# Patient Record
Sex: Female | Born: 1975
Health system: Southern US, Community
[De-identification: ages and names within clinical notes are randomized; demographics above are authoritative.]

## PROBLEM LIST (undated history)

## (undated) DIAGNOSIS — T7840XA Allergy, unspecified, initial encounter: Secondary | ICD-10-CM

## (undated) DIAGNOSIS — J45909 Unspecified asthma, uncomplicated: Secondary | ICD-10-CM

## (undated) HISTORY — DX: Allergy, unspecified, initial encounter: T78.40XA

## (undated) HISTORY — PX: FINGER SURGERY: SHX640

## (undated) HISTORY — DX: Unspecified asthma, uncomplicated: J45.909

---

## 2013-06-30 ENCOUNTER — Encounter: Payer: Self-pay | Admitting: Podiatry

## 2013-06-30 ENCOUNTER — Ambulatory Visit (INDEPENDENT_AMBULATORY_CARE_PROVIDER_SITE_OTHER): Payer: BC Managed Care – PPO | Admitting: Podiatry

## 2013-06-30 VITALS — BP 122/78 | HR 82 | Resp 16 | Ht 64.0 in | Wt 185.0 lb

## 2013-06-30 DIAGNOSIS — L03039 Cellulitis of unspecified toe: Secondary | ICD-10-CM

## 2013-06-30 MED ORDER — CEPHALEXIN 500 MG PO CAPS: 500.0000 mg | ORAL_CAPSULE | Freq: Three times a day (TID) | ORAL | Status: DC

## 2013-06-30 NOTE — Progress Notes (Signed)
   Subjective:    Patient ID: Chelsea Drake, female    DOB: 07-24-1975, 37 y.o.   MRN: [id number]  HPI Comments: "My toes are bothering me"  Pt c/o of pain in toes at nail beds. Mainly, the 1st, 2nd, 3rd right and 2nd toe left. They get red and swollen and aches. Does say that she takes spin classes and shoes sometimes feel tight. This started on Thursday, Dec. 11th. Tried neosporin and a bandaid and some soaking.      Review of Systems  All other systems reviewed and are negative.       Objective:   Physical Exam: I have reviewed her past medical history medications allergies surgeries social history. Vital signs are stable she is alert and oriented x3. Pulses are strongly palpable bilateral. Capillary fill time to digits one through 5 bilateral is noted to be somewhat slow in the plantar foot demonstrates a splotchy pattern was slow capillary return. Feet are warm to the touch. Neurologic sensorium is intact per since once the monofilament. Deep tendon reflexes are intact bilateral and symmetrical. Orthopedic evaluation Mr. is all joints distal to the ankle a full range of motion without crepitus. Cutaneous evaluation other than the areas of splotchiness demonstrate toes 2 and 3 of the right foot erythema around the nails again this is splotchy erythema to the level of the DIPJ and the second digit left foot demonstrates similar findings.        Assessment & Plan:  Assessment: Possible nail trauma and tissue trauma associated with tight shoe gear for spin class. Cellulitis toes bilateral. Rule out chilblains   Plan: Epsom salts warm water soaks twice daily. Keflex 500 mg 1 by mouth 3 times a day x10 days. Followup with her in about 3 weeks

## 2013-07-21 ENCOUNTER — Ambulatory Visit: Payer: BC Managed Care – PPO | Admitting: Podiatry

## 2013-07-24 ENCOUNTER — Telehealth: Payer: Self-pay | Admitting: *Deleted

## 2013-07-24 MED ORDER — CEPHALEXIN 500 MG PO CAPS: 500.0000 mg | ORAL_CAPSULE | Freq: Three times a day (TID) | ORAL | Status: DC

## 2013-07-24 NOTE — Telephone Encounter (Signed)
Pt complains of stiff, red and swollen Right 2nd toe, that DR Al CorpusHyatt treated her in 06/2013 for what they thought to be injuries to her toes from too tight athletic shoes, was prescribed and antibiotic and they got better.  Pt states a day or two ago the Right 2nd toe began to be painful.  Pt states she has an appt 07/28/2013 with Dr Al CorpusHyatt, but what to do until then.. I asked pt if she were able to take Ibuprofen and she said yes.  I told her to take OTC Ibuprofen as the package directs, soak 1 - 2 times daily in 1/4 C Epsom Salt and 1 qt warm water, and I would refill the Cephalexin.  Pt agreed and states may wait on the antibiotic to see if improved with the other instructions.  I told her it would be at her pharmacy if she needed it.

## 2013-07-28 ENCOUNTER — Ambulatory Visit: Payer: BC Managed Care – PPO | Admitting: Podiatry

## 2013-07-30 ENCOUNTER — Ambulatory Visit: Payer: BC Managed Care – PPO | Admitting: Podiatry

## 2013-08-04 ENCOUNTER — Ambulatory Visit: Payer: BC Managed Care – PPO | Admitting: Podiatry

## 2013-08-17 ENCOUNTER — Telehealth: Payer: Self-pay | Admitting: *Deleted

## 2013-08-17 NOTE — Telephone Encounter (Signed)
Scheduled pt to come in on 09-01-13

## 2013-08-17 NOTE — Telephone Encounter (Signed)
Pt states has had multiple episodes of this ingrown, becoming painful, picked up the last antibiotic on Saturday.  I offered an appt, but pt states would like to take the antibiotic to completion and come in around the 16th to see if it acted up again.  I will have the schedulers call and schedule.

## 2013-09-01 ENCOUNTER — Ambulatory Visit: Payer: BC Managed Care – PPO | Admitting: Podiatry

## 2013-09-10 ENCOUNTER — Ambulatory Visit: Payer: BC Managed Care – PPO | Admitting: Podiatry

## 2016-01-13 DIAGNOSIS — J3089 Other allergic rhinitis: Secondary | ICD-10-CM | POA: Diagnosis not present

## 2016-02-17 DIAGNOSIS — N943 Premenstrual tension syndrome: Secondary | ICD-10-CM | POA: Diagnosis not present

## 2016-02-17 DIAGNOSIS — Z3169 Encounter for other general counseling and advice on procreation: Secondary | ICD-10-CM | POA: Diagnosis not present

## 2016-02-17 DIAGNOSIS — N898 Other specified noninflammatory disorders of vagina: Secondary | ICD-10-CM | POA: Diagnosis not present

## 2016-03-29 DIAGNOSIS — Z3169 Encounter for other general counseling and advice on procreation: Secondary | ICD-10-CM | POA: Diagnosis not present

## 2016-03-29 DIAGNOSIS — N943 Premenstrual tension syndrome: Secondary | ICD-10-CM | POA: Diagnosis not present

## 2016-03-29 DIAGNOSIS — R5383 Other fatigue: Secondary | ICD-10-CM | POA: Diagnosis not present

## 2016-04-02 DIAGNOSIS — J3089 Other allergic rhinitis: Secondary | ICD-10-CM | POA: Diagnosis not present

## 2016-05-09 DIAGNOSIS — Z23 Encounter for immunization: Secondary | ICD-10-CM | POA: Diagnosis not present

## 2016-06-27 DIAGNOSIS — J3089 Other allergic rhinitis: Secondary | ICD-10-CM | POA: Diagnosis not present

## 2016-06-27 DIAGNOSIS — Z0000 Encounter for general adult medical examination without abnormal findings: Secondary | ICD-10-CM | POA: Diagnosis not present

## 2016-07-04 DIAGNOSIS — J020 Streptococcal pharyngitis: Secondary | ICD-10-CM | POA: Diagnosis not present

## 2016-07-04 DIAGNOSIS — J029 Acute pharyngitis, unspecified: Secondary | ICD-10-CM | POA: Diagnosis not present

## 2016-07-04 DIAGNOSIS — R52 Pain, unspecified: Secondary | ICD-10-CM | POA: Diagnosis not present

## 2016-07-18 DIAGNOSIS — R6883 Chills (without fever): Secondary | ICD-10-CM | POA: Diagnosis not present

## 2016-07-18 DIAGNOSIS — R52 Pain, unspecified: Secondary | ICD-10-CM | POA: Diagnosis not present

## 2016-07-18 DIAGNOSIS — J358 Other chronic diseases of tonsils and adenoids: Secondary | ICD-10-CM | POA: Diagnosis not present

## 2016-07-18 DIAGNOSIS — R5383 Other fatigue: Secondary | ICD-10-CM | POA: Diagnosis not present

## 2016-07-18 DIAGNOSIS — R3915 Urgency of urination: Secondary | ICD-10-CM | POA: Diagnosis not present

## 2016-07-18 DIAGNOSIS — J351 Hypertrophy of tonsils: Secondary | ICD-10-CM | POA: Diagnosis not present

## 2016-07-18 DIAGNOSIS — Z8709 Personal history of other diseases of the respiratory system: Secondary | ICD-10-CM | POA: Diagnosis not present

## 2016-07-18 DIAGNOSIS — B349 Viral infection, unspecified: Secondary | ICD-10-CM | POA: Diagnosis not present

## 2016-08-13 DIAGNOSIS — Z3202 Encounter for pregnancy test, result negative: Secondary | ICD-10-CM | POA: Diagnosis not present

## 2016-08-13 DIAGNOSIS — Z01419 Encounter for gynecological examination (general) (routine) without abnormal findings: Secondary | ICD-10-CM | POA: Diagnosis not present

## 2016-08-13 DIAGNOSIS — Z6830 Body mass index (BMI) 30.0-30.9, adult: Secondary | ICD-10-CM | POA: Diagnosis not present

## 2016-08-27 DIAGNOSIS — M9905 Segmental and somatic dysfunction of pelvic region: Secondary | ICD-10-CM | POA: Diagnosis not present

## 2016-08-27 DIAGNOSIS — M9903 Segmental and somatic dysfunction of lumbar region: Secondary | ICD-10-CM | POA: Diagnosis not present

## 2016-08-27 DIAGNOSIS — M9902 Segmental and somatic dysfunction of thoracic region: Secondary | ICD-10-CM | POA: Diagnosis not present

## 2016-08-27 DIAGNOSIS — M791 Myalgia: Secondary | ICD-10-CM | POA: Diagnosis not present

## 2016-08-31 DIAGNOSIS — M791 Myalgia: Secondary | ICD-10-CM | POA: Diagnosis not present

## 2016-08-31 DIAGNOSIS — M9903 Segmental and somatic dysfunction of lumbar region: Secondary | ICD-10-CM | POA: Diagnosis not present

## 2016-08-31 DIAGNOSIS — M9905 Segmental and somatic dysfunction of pelvic region: Secondary | ICD-10-CM | POA: Diagnosis not present

## 2016-08-31 DIAGNOSIS — M9902 Segmental and somatic dysfunction of thoracic region: Secondary | ICD-10-CM | POA: Diagnosis not present

## 2016-09-03 DIAGNOSIS — M9902 Segmental and somatic dysfunction of thoracic region: Secondary | ICD-10-CM | POA: Diagnosis not present

## 2016-09-03 DIAGNOSIS — M9905 Segmental and somatic dysfunction of pelvic region: Secondary | ICD-10-CM | POA: Diagnosis not present

## 2016-09-03 DIAGNOSIS — M791 Myalgia: Secondary | ICD-10-CM | POA: Diagnosis not present

## 2016-09-03 DIAGNOSIS — M9903 Segmental and somatic dysfunction of lumbar region: Secondary | ICD-10-CM | POA: Diagnosis not present

## 2016-09-12 DIAGNOSIS — M9902 Segmental and somatic dysfunction of thoracic region: Secondary | ICD-10-CM | POA: Diagnosis not present

## 2016-09-12 DIAGNOSIS — M9903 Segmental and somatic dysfunction of lumbar region: Secondary | ICD-10-CM | POA: Diagnosis not present

## 2016-09-12 DIAGNOSIS — M9905 Segmental and somatic dysfunction of pelvic region: Secondary | ICD-10-CM | POA: Diagnosis not present

## 2016-09-12 DIAGNOSIS — M791 Myalgia: Secondary | ICD-10-CM | POA: Diagnosis not present

## 2016-09-18 DIAGNOSIS — D2230 Melanocytic nevi of unspecified part of face: Secondary | ICD-10-CM | POA: Diagnosis not present

## 2016-09-18 DIAGNOSIS — L700 Acne vulgaris: Secondary | ICD-10-CM | POA: Diagnosis not present

## 2016-09-19 DIAGNOSIS — M791 Myalgia: Secondary | ICD-10-CM | POA: Diagnosis not present

## 2016-09-19 DIAGNOSIS — M9905 Segmental and somatic dysfunction of pelvic region: Secondary | ICD-10-CM | POA: Diagnosis not present

## 2016-09-19 DIAGNOSIS — M9903 Segmental and somatic dysfunction of lumbar region: Secondary | ICD-10-CM | POA: Diagnosis not present

## 2016-09-19 DIAGNOSIS — M9902 Segmental and somatic dysfunction of thoracic region: Secondary | ICD-10-CM | POA: Diagnosis not present

## 2016-10-08 DIAGNOSIS — J3089 Other allergic rhinitis: Secondary | ICD-10-CM | POA: Diagnosis not present

## 2016-10-10 DIAGNOSIS — M25551 Pain in right hip: Secondary | ICD-10-CM | POA: Diagnosis not present

## 2016-10-10 DIAGNOSIS — G8929 Other chronic pain: Secondary | ICD-10-CM | POA: Diagnosis not present

## 2016-11-05 DIAGNOSIS — M79672 Pain in left foot: Secondary | ICD-10-CM | POA: Diagnosis not present

## 2016-11-07 DIAGNOSIS — M79672 Pain in left foot: Secondary | ICD-10-CM | POA: Diagnosis not present

## 2017-01-25 DIAGNOSIS — J3089 Other allergic rhinitis: Secondary | ICD-10-CM | POA: Diagnosis not present

## 2017-04-12 DIAGNOSIS — Z23 Encounter for immunization: Secondary | ICD-10-CM | POA: Diagnosis not present

## 2017-04-24 DIAGNOSIS — N926 Irregular menstruation, unspecified: Secondary | ICD-10-CM | POA: Diagnosis not present

## 2017-05-07 DIAGNOSIS — L309 Dermatitis, unspecified: Secondary | ICD-10-CM | POA: Diagnosis not present

## 2017-05-07 DIAGNOSIS — L719 Rosacea, unspecified: Secondary | ICD-10-CM | POA: Diagnosis not present

## 2017-05-08 DIAGNOSIS — N926 Irregular menstruation, unspecified: Secondary | ICD-10-CM | POA: Diagnosis not present

## 2017-05-15 DIAGNOSIS — J3089 Other allergic rhinitis: Secondary | ICD-10-CM | POA: Diagnosis not present

## 2017-05-28 DIAGNOSIS — N926 Irregular menstruation, unspecified: Secondary | ICD-10-CM | POA: Diagnosis not present

## 2017-06-13 DIAGNOSIS — D1801 Hemangioma of skin and subcutaneous tissue: Secondary | ICD-10-CM | POA: Diagnosis not present

## 2017-06-13 DIAGNOSIS — D224 Melanocytic nevi of scalp and neck: Secondary | ICD-10-CM | POA: Diagnosis not present

## 2017-06-13 DIAGNOSIS — L814 Other melanin hyperpigmentation: Secondary | ICD-10-CM | POA: Diagnosis not present

## 2017-06-13 DIAGNOSIS — D225 Melanocytic nevi of trunk: Secondary | ICD-10-CM | POA: Diagnosis not present

## 2017-06-25 DIAGNOSIS — J020 Streptococcal pharyngitis: Secondary | ICD-10-CM | POA: Diagnosis not present

## 2017-08-01 DIAGNOSIS — M9903 Segmental and somatic dysfunction of lumbar region: Secondary | ICD-10-CM | POA: Diagnosis not present

## 2017-08-01 DIAGNOSIS — M9905 Segmental and somatic dysfunction of pelvic region: Secondary | ICD-10-CM | POA: Diagnosis not present

## 2017-08-01 DIAGNOSIS — M62830 Muscle spasm of back: Secondary | ICD-10-CM | POA: Diagnosis not present

## 2017-08-01 DIAGNOSIS — M9902 Segmental and somatic dysfunction of thoracic region: Secondary | ICD-10-CM | POA: Diagnosis not present

## 2017-08-01 DIAGNOSIS — M791 Myalgia, unspecified site: Secondary | ICD-10-CM | POA: Diagnosis not present

## 2017-08-15 DIAGNOSIS — Z01419 Encounter for gynecological examination (general) (routine) without abnormal findings: Secondary | ICD-10-CM | POA: Diagnosis not present

## 2017-08-15 DIAGNOSIS — Z3200 Encounter for pregnancy test, result unknown: Secondary | ICD-10-CM | POA: Diagnosis not present

## 2017-08-15 DIAGNOSIS — Z1231 Encounter for screening mammogram for malignant neoplasm of breast: Secondary | ICD-10-CM | POA: Diagnosis not present

## 2017-08-15 DIAGNOSIS — Z6831 Body mass index (BMI) 31.0-31.9, adult: Secondary | ICD-10-CM | POA: Diagnosis not present

## 2017-08-15 DIAGNOSIS — N926 Irregular menstruation, unspecified: Secondary | ICD-10-CM | POA: Diagnosis not present

## 2017-08-15 DIAGNOSIS — N911 Secondary amenorrhea: Secondary | ICD-10-CM | POA: Diagnosis not present

## 2017-08-16 DIAGNOSIS — Z760 Encounter for issue of repeat prescription: Secondary | ICD-10-CM | POA: Diagnosis not present

## 2017-08-16 DIAGNOSIS — J4520 Mild intermittent asthma, uncomplicated: Secondary | ICD-10-CM | POA: Diagnosis not present

## 2017-08-16 DIAGNOSIS — J3089 Other allergic rhinitis: Secondary | ICD-10-CM | POA: Diagnosis not present

## 2017-08-30 DIAGNOSIS — S0501XD Injury of conjunctiva and corneal abrasion without foreign body, right eye, subsequent encounter: Secondary | ICD-10-CM | POA: Diagnosis not present

## 2017-09-06 DIAGNOSIS — M9905 Segmental and somatic dysfunction of pelvic region: Secondary | ICD-10-CM | POA: Diagnosis not present

## 2017-09-06 DIAGNOSIS — M791 Myalgia, unspecified site: Secondary | ICD-10-CM | POA: Diagnosis not present

## 2017-09-06 DIAGNOSIS — M62830 Muscle spasm of back: Secondary | ICD-10-CM | POA: Diagnosis not present

## 2017-09-06 DIAGNOSIS — M9903 Segmental and somatic dysfunction of lumbar region: Secondary | ICD-10-CM | POA: Diagnosis not present

## 2017-09-06 DIAGNOSIS — M9902 Segmental and somatic dysfunction of thoracic region: Secondary | ICD-10-CM | POA: Diagnosis not present

## 2017-09-16 DIAGNOSIS — Z713 Dietary counseling and surveillance: Secondary | ICD-10-CM | POA: Diagnosis not present

## 2017-09-16 DIAGNOSIS — Z6831 Body mass index (BMI) 31.0-31.9, adult: Secondary | ICD-10-CM | POA: Diagnosis not present

## 2017-10-14 DIAGNOSIS — Z713 Dietary counseling and surveillance: Secondary | ICD-10-CM | POA: Diagnosis not present

## 2017-10-14 DIAGNOSIS — Z6831 Body mass index (BMI) 31.0-31.9, adult: Secondary | ICD-10-CM | POA: Diagnosis not present

## 2017-11-01 DIAGNOSIS — L2089 Other atopic dermatitis: Secondary | ICD-10-CM | POA: Diagnosis not present

## 2017-11-01 DIAGNOSIS — L718 Other rosacea: Secondary | ICD-10-CM | POA: Diagnosis not present

## 2017-11-01 DIAGNOSIS — L814 Other melanin hyperpigmentation: Secondary | ICD-10-CM | POA: Diagnosis not present

## 2017-12-02 DIAGNOSIS — J3089 Other allergic rhinitis: Secondary | ICD-10-CM | POA: Diagnosis not present

## 2017-12-30 DIAGNOSIS — Z6831 Body mass index (BMI) 31.0-31.9, adult: Secondary | ICD-10-CM | POA: Diagnosis not present

## 2017-12-30 DIAGNOSIS — Z713 Dietary counseling and surveillance: Secondary | ICD-10-CM | POA: Diagnosis not present

## 2018-01-29 DIAGNOSIS — M9905 Segmental and somatic dysfunction of pelvic region: Secondary | ICD-10-CM | POA: Diagnosis not present

## 2018-01-29 DIAGNOSIS — M9903 Segmental and somatic dysfunction of lumbar region: Secondary | ICD-10-CM | POA: Diagnosis not present

## 2018-01-29 DIAGNOSIS — M62830 Muscle spasm of back: Secondary | ICD-10-CM | POA: Diagnosis not present

## 2018-01-29 DIAGNOSIS — M9902 Segmental and somatic dysfunction of thoracic region: Secondary | ICD-10-CM | POA: Diagnosis not present

## 2018-02-10 DIAGNOSIS — M9903 Segmental and somatic dysfunction of lumbar region: Secondary | ICD-10-CM | POA: Diagnosis not present

## 2018-02-10 DIAGNOSIS — M9902 Segmental and somatic dysfunction of thoracic region: Secondary | ICD-10-CM | POA: Diagnosis not present

## 2018-02-10 DIAGNOSIS — M9905 Segmental and somatic dysfunction of pelvic region: Secondary | ICD-10-CM | POA: Diagnosis not present

## 2018-02-10 DIAGNOSIS — M62830 Muscle spasm of back: Secondary | ICD-10-CM | POA: Diagnosis not present

## 2018-02-12 DIAGNOSIS — N766 Ulceration of vulva: Secondary | ICD-10-CM | POA: Diagnosis not present

## 2018-02-12 DIAGNOSIS — R300 Dysuria: Secondary | ICD-10-CM | POA: Diagnosis not present

## 2018-02-27 DIAGNOSIS — M9903 Segmental and somatic dysfunction of lumbar region: Secondary | ICD-10-CM | POA: Diagnosis not present

## 2018-02-27 DIAGNOSIS — M9905 Segmental and somatic dysfunction of pelvic region: Secondary | ICD-10-CM | POA: Diagnosis not present

## 2018-02-27 DIAGNOSIS — M62830 Muscle spasm of back: Secondary | ICD-10-CM | POA: Diagnosis not present

## 2018-02-27 DIAGNOSIS — M9902 Segmental and somatic dysfunction of thoracic region: Secondary | ICD-10-CM | POA: Diagnosis not present

## 2018-03-11 DIAGNOSIS — M62830 Muscle spasm of back: Secondary | ICD-10-CM | POA: Diagnosis not present

## 2018-03-11 DIAGNOSIS — M9903 Segmental and somatic dysfunction of lumbar region: Secondary | ICD-10-CM | POA: Diagnosis not present

## 2018-03-11 DIAGNOSIS — M9905 Segmental and somatic dysfunction of pelvic region: Secondary | ICD-10-CM | POA: Diagnosis not present

## 2018-03-11 DIAGNOSIS — M9902 Segmental and somatic dysfunction of thoracic region: Secondary | ICD-10-CM | POA: Diagnosis not present

## 2018-03-25 DIAGNOSIS — M62830 Muscle spasm of back: Secondary | ICD-10-CM | POA: Diagnosis not present

## 2018-03-25 DIAGNOSIS — M9903 Segmental and somatic dysfunction of lumbar region: Secondary | ICD-10-CM | POA: Diagnosis not present

## 2018-03-25 DIAGNOSIS — M9905 Segmental and somatic dysfunction of pelvic region: Secondary | ICD-10-CM | POA: Diagnosis not present

## 2018-03-25 DIAGNOSIS — M9902 Segmental and somatic dysfunction of thoracic region: Secondary | ICD-10-CM | POA: Diagnosis not present

## 2018-07-28 DIAGNOSIS — J3089 Other allergic rhinitis: Secondary | ICD-10-CM | POA: Diagnosis not present

## 2018-07-28 DIAGNOSIS — J4520 Mild intermittent asthma, uncomplicated: Secondary | ICD-10-CM | POA: Diagnosis not present

## 2018-07-30 DIAGNOSIS — R790 Abnormal level of blood mineral: Secondary | ICD-10-CM | POA: Diagnosis not present

## 2018-07-30 DIAGNOSIS — E669 Obesity, unspecified: Secondary | ICD-10-CM | POA: Diagnosis not present

## 2018-07-30 DIAGNOSIS — E782 Mixed hyperlipidemia: Secondary | ICD-10-CM | POA: Diagnosis not present

## 2018-07-30 DIAGNOSIS — R7989 Other specified abnormal findings of blood chemistry: Secondary | ICD-10-CM | POA: Diagnosis not present

## 2018-08-06 DIAGNOSIS — E782 Mixed hyperlipidemia: Secondary | ICD-10-CM | POA: Diagnosis not present

## 2018-08-06 DIAGNOSIS — E669 Obesity, unspecified: Secondary | ICD-10-CM | POA: Diagnosis not present

## 2018-08-13 DIAGNOSIS — E669 Obesity, unspecified: Secondary | ICD-10-CM | POA: Diagnosis not present

## 2018-08-13 DIAGNOSIS — R7989 Other specified abnormal findings of blood chemistry: Secondary | ICD-10-CM | POA: Diagnosis not present

## 2018-08-19 DIAGNOSIS — Z6831 Body mass index (BMI) 31.0-31.9, adult: Secondary | ICD-10-CM | POA: Diagnosis not present

## 2018-08-19 DIAGNOSIS — Z01419 Encounter for gynecological examination (general) (routine) without abnormal findings: Secondary | ICD-10-CM | POA: Diagnosis not present

## 2018-08-19 DIAGNOSIS — Z1231 Encounter for screening mammogram for malignant neoplasm of breast: Secondary | ICD-10-CM | POA: Diagnosis not present

## 2018-08-19 DIAGNOSIS — Z1151 Encounter for screening for human papillomavirus (HPV): Secondary | ICD-10-CM | POA: Diagnosis not present

## 2018-08-19 DIAGNOSIS — Z113 Encounter for screening for infections with a predominantly sexual mode of transmission: Secondary | ICD-10-CM | POA: Diagnosis not present

## 2018-08-20 DIAGNOSIS — Z6832 Body mass index (BMI) 32.0-32.9, adult: Secondary | ICD-10-CM | POA: Diagnosis not present

## 2018-08-20 DIAGNOSIS — R790 Abnormal level of blood mineral: Secondary | ICD-10-CM | POA: Diagnosis not present

## 2018-08-27 ENCOUNTER — Ambulatory Visit: Payer: Self-pay | Admitting: Family Medicine

## 2018-09-06 DIAGNOSIS — M9902 Segmental and somatic dysfunction of thoracic region: Secondary | ICD-10-CM | POA: Diagnosis not present

## 2018-09-06 DIAGNOSIS — M9903 Segmental and somatic dysfunction of lumbar region: Secondary | ICD-10-CM | POA: Diagnosis not present

## 2018-09-06 DIAGNOSIS — M62830 Muscle spasm of back: Secondary | ICD-10-CM | POA: Diagnosis not present

## 2018-09-06 DIAGNOSIS — M9905 Segmental and somatic dysfunction of pelvic region: Secondary | ICD-10-CM | POA: Diagnosis not present

## 2018-09-15 ENCOUNTER — Ambulatory Visit: Payer: Self-pay | Admitting: Family Medicine

## 2018-09-18 DIAGNOSIS — Z0000 Encounter for general adult medical examination without abnormal findings: Secondary | ICD-10-CM | POA: Diagnosis not present

## 2018-09-19 DIAGNOSIS — J069 Acute upper respiratory infection, unspecified: Secondary | ICD-10-CM | POA: Diagnosis not present

## 2018-09-19 DIAGNOSIS — Z0000 Encounter for general adult medical examination without abnormal findings: Secondary | ICD-10-CM | POA: Diagnosis not present

## 2018-09-19 DIAGNOSIS — Z760 Encounter for issue of repeat prescription: Secondary | ICD-10-CM | POA: Diagnosis not present

## 2018-09-19 DIAGNOSIS — J4520 Mild intermittent asthma, uncomplicated: Secondary | ICD-10-CM | POA: Diagnosis not present

## 2018-09-26 DIAGNOSIS — J069 Acute upper respiratory infection, unspecified: Secondary | ICD-10-CM | POA: Diagnosis not present

## 2018-10-01 DIAGNOSIS — F410 Panic disorder [episodic paroxysmal anxiety] without agoraphobia: Secondary | ICD-10-CM | POA: Diagnosis not present

## 2018-10-01 DIAGNOSIS — F419 Anxiety disorder, unspecified: Secondary | ICD-10-CM | POA: Diagnosis not present

## 2018-10-06 ENCOUNTER — Ambulatory Visit: Payer: Self-pay | Admitting: Family Medicine

## 2018-10-08 DIAGNOSIS — F419 Anxiety disorder, unspecified: Secondary | ICD-10-CM | POA: Diagnosis not present

## 2018-10-08 DIAGNOSIS — F410 Panic disorder [episodic paroxysmal anxiety] without agoraphobia: Secondary | ICD-10-CM | POA: Diagnosis not present

## 2018-10-13 DIAGNOSIS — L650 Telogen effluvium: Secondary | ICD-10-CM | POA: Diagnosis not present

## 2018-10-15 DIAGNOSIS — F419 Anxiety disorder, unspecified: Secondary | ICD-10-CM | POA: Diagnosis not present

## 2018-10-15 DIAGNOSIS — F410 Panic disorder [episodic paroxysmal anxiety] without agoraphobia: Secondary | ICD-10-CM | POA: Diagnosis not present

## 2018-10-16 DIAGNOSIS — J4520 Mild intermittent asthma, uncomplicated: Secondary | ICD-10-CM | POA: Diagnosis not present

## 2018-10-16 DIAGNOSIS — L659 Nonscarring hair loss, unspecified: Secondary | ICD-10-CM | POA: Diagnosis not present

## 2018-10-16 DIAGNOSIS — Z760 Encounter for issue of repeat prescription: Secondary | ICD-10-CM | POA: Diagnosis not present

## 2018-10-21 DIAGNOSIS — F410 Panic disorder [episodic paroxysmal anxiety] without agoraphobia: Secondary | ICD-10-CM | POA: Diagnosis not present

## 2018-10-21 DIAGNOSIS — F419 Anxiety disorder, unspecified: Secondary | ICD-10-CM | POA: Diagnosis not present

## 2018-10-29 DIAGNOSIS — F419 Anxiety disorder, unspecified: Secondary | ICD-10-CM | POA: Diagnosis not present

## 2018-10-29 DIAGNOSIS — F410 Panic disorder [episodic paroxysmal anxiety] without agoraphobia: Secondary | ICD-10-CM | POA: Diagnosis not present

## 2018-11-05 DIAGNOSIS — F419 Anxiety disorder, unspecified: Secondary | ICD-10-CM | POA: Diagnosis not present

## 2018-11-05 DIAGNOSIS — F410 Panic disorder [episodic paroxysmal anxiety] without agoraphobia: Secondary | ICD-10-CM | POA: Diagnosis not present

## 2018-11-07 ENCOUNTER — Ambulatory Visit: Payer: Self-pay | Admitting: Family Medicine

## 2018-11-12 DIAGNOSIS — F419 Anxiety disorder, unspecified: Secondary | ICD-10-CM | POA: Diagnosis not present

## 2018-11-12 DIAGNOSIS — F410 Panic disorder [episodic paroxysmal anxiety] without agoraphobia: Secondary | ICD-10-CM | POA: Diagnosis not present

## 2018-11-19 DIAGNOSIS — F419 Anxiety disorder, unspecified: Secondary | ICD-10-CM | POA: Diagnosis not present

## 2018-11-19 DIAGNOSIS — F410 Panic disorder [episodic paroxysmal anxiety] without agoraphobia: Secondary | ICD-10-CM | POA: Diagnosis not present

## 2018-11-25 DIAGNOSIS — F410 Panic disorder [episodic paroxysmal anxiety] without agoraphobia: Secondary | ICD-10-CM | POA: Diagnosis not present

## 2018-11-25 DIAGNOSIS — Z760 Encounter for issue of repeat prescription: Secondary | ICD-10-CM | POA: Diagnosis not present

## 2018-11-25 DIAGNOSIS — J3089 Other allergic rhinitis: Secondary | ICD-10-CM | POA: Diagnosis not present

## 2018-11-25 DIAGNOSIS — J4520 Mild intermittent asthma, uncomplicated: Secondary | ICD-10-CM | POA: Diagnosis not present

## 2018-11-25 DIAGNOSIS — F419 Anxiety disorder, unspecified: Secondary | ICD-10-CM | POA: Diagnosis not present

## 2018-11-28 DIAGNOSIS — M62830 Muscle spasm of back: Secondary | ICD-10-CM | POA: Diagnosis not present

## 2018-11-28 DIAGNOSIS — M9903 Segmental and somatic dysfunction of lumbar region: Secondary | ICD-10-CM | POA: Diagnosis not present

## 2018-11-28 DIAGNOSIS — M9905 Segmental and somatic dysfunction of pelvic region: Secondary | ICD-10-CM | POA: Diagnosis not present

## 2018-11-28 DIAGNOSIS — M9902 Segmental and somatic dysfunction of thoracic region: Secondary | ICD-10-CM | POA: Diagnosis not present

## 2018-12-03 DIAGNOSIS — F419 Anxiety disorder, unspecified: Secondary | ICD-10-CM | POA: Diagnosis not present

## 2018-12-03 DIAGNOSIS — F410 Panic disorder [episodic paroxysmal anxiety] without agoraphobia: Secondary | ICD-10-CM | POA: Diagnosis not present

## 2018-12-04 ENCOUNTER — Other Ambulatory Visit: Payer: Self-pay

## 2018-12-04 DIAGNOSIS — J069 Acute upper respiratory infection, unspecified: Secondary | ICD-10-CM | POA: Diagnosis not present

## 2018-12-05 LAB — SAR COV2 SEROLOGY (COVID19)AB(IGG),IA: SARS-CoV-2 Ab, IgG: NEGATIVE

## 2018-12-10 DIAGNOSIS — F410 Panic disorder [episodic paroxysmal anxiety] without agoraphobia: Secondary | ICD-10-CM | POA: Diagnosis not present

## 2018-12-10 DIAGNOSIS — F419 Anxiety disorder, unspecified: Secondary | ICD-10-CM | POA: Diagnosis not present

## 2018-12-11 DIAGNOSIS — L658 Other specified nonscarring hair loss: Secondary | ICD-10-CM | POA: Diagnosis not present

## 2018-12-11 DIAGNOSIS — N898 Other specified noninflammatory disorders of vagina: Secondary | ICD-10-CM | POA: Diagnosis not present

## 2018-12-12 DIAGNOSIS — L658 Other specified nonscarring hair loss: Secondary | ICD-10-CM | POA: Diagnosis not present

## 2018-12-17 DIAGNOSIS — F410 Panic disorder [episodic paroxysmal anxiety] without agoraphobia: Secondary | ICD-10-CM | POA: Diagnosis not present

## 2018-12-17 DIAGNOSIS — L658 Other specified nonscarring hair loss: Secondary | ICD-10-CM | POA: Diagnosis not present

## 2018-12-17 DIAGNOSIS — F419 Anxiety disorder, unspecified: Secondary | ICD-10-CM | POA: Diagnosis not present

## 2018-12-17 DIAGNOSIS — L659 Nonscarring hair loss, unspecified: Secondary | ICD-10-CM | POA: Diagnosis not present

## 2018-12-24 DIAGNOSIS — F410 Panic disorder [episodic paroxysmal anxiety] without agoraphobia: Secondary | ICD-10-CM | POA: Diagnosis not present

## 2018-12-24 DIAGNOSIS — F419 Anxiety disorder, unspecified: Secondary | ICD-10-CM | POA: Diagnosis not present

## 2018-12-29 DIAGNOSIS — F419 Anxiety disorder, unspecified: Secondary | ICD-10-CM | POA: Diagnosis not present

## 2018-12-29 DIAGNOSIS — F410 Panic disorder [episodic paroxysmal anxiety] without agoraphobia: Secondary | ICD-10-CM | POA: Diagnosis not present

## 2019-01-09 DIAGNOSIS — F419 Anxiety disorder, unspecified: Secondary | ICD-10-CM | POA: Diagnosis not present

## 2019-01-09 DIAGNOSIS — F410 Panic disorder [episodic paroxysmal anxiety] without agoraphobia: Secondary | ICD-10-CM | POA: Diagnosis not present

## 2019-01-14 DIAGNOSIS — F419 Anxiety disorder, unspecified: Secondary | ICD-10-CM | POA: Diagnosis not present

## 2019-01-14 DIAGNOSIS — F410 Panic disorder [episodic paroxysmal anxiety] without agoraphobia: Secondary | ICD-10-CM | POA: Diagnosis not present

## 2019-01-19 DIAGNOSIS — L738 Other specified follicular disorders: Secondary | ICD-10-CM | POA: Diagnosis not present

## 2019-01-19 DIAGNOSIS — L559 Sunburn, unspecified: Secondary | ICD-10-CM | POA: Diagnosis not present

## 2019-01-19 DIAGNOSIS — L728 Other follicular cysts of the skin and subcutaneous tissue: Secondary | ICD-10-CM | POA: Diagnosis not present

## 2019-01-19 DIAGNOSIS — L658 Other specified nonscarring hair loss: Secondary | ICD-10-CM | POA: Diagnosis not present

## 2019-01-21 ENCOUNTER — Encounter: Payer: Self-pay | Admitting: Family Medicine

## 2019-01-21 ENCOUNTER — Other Ambulatory Visit: Payer: Self-pay

## 2019-01-21 ENCOUNTER — Ambulatory Visit: Payer: BC Managed Care – PPO | Admitting: Family Medicine

## 2019-01-21 DIAGNOSIS — F410 Panic disorder [episodic paroxysmal anxiety] without agoraphobia: Secondary | ICD-10-CM | POA: Diagnosis not present

## 2019-01-21 DIAGNOSIS — J302 Other seasonal allergic rhinitis: Secondary | ICD-10-CM

## 2019-01-21 DIAGNOSIS — J4520 Mild intermittent asthma, uncomplicated: Secondary | ICD-10-CM

## 2019-01-21 DIAGNOSIS — F419 Anxiety disorder, unspecified: Secondary | ICD-10-CM

## 2019-01-21 DIAGNOSIS — J45909 Unspecified asthma, uncomplicated: Secondary | ICD-10-CM | POA: Insufficient documentation

## 2019-01-21 MED ORDER — ALBUTEROL SULFATE HFA 108 (90 BASE) MCG/ACT IN AERS: 2.0000 | INHALATION_SPRAY | Freq: Four times a day (QID) | RESPIRATORY_TRACT | Status: DC | PRN

## 2019-01-21 MED ORDER — HYDROXYZINE HCL 25 MG PO TABS: 12.5000 mg | ORAL_TABLET | Freq: Every day | ORAL | 2 refills | Status: DC | PRN

## 2019-01-21 NOTE — Progress Notes (Signed)
Chelsea Drake DOB: 08/22/1975 Encounter date: 01/21/2019  This is a 42 y.o. female who presents to establish care. Chief Complaint  Patient presents with  . Establish Care    History of present illness: Last August - woke up and had red bumps outside of vagina - hadn't had those before. Went to obgyn and had swab done and thought that it was herpes. Bumps did get worse while waiting for cx. More bumps and had started to note a couple on bottom - also had some swollen glands, nerve pain, fatigue/viral type sx. Was given valtrex which improved sx significantly in 2 days but culture came back negative. Dad (who is gyn) stated that it could have been shingles.   Hasn't had regular doc since moving here 6 years ago. She just sees NP at work and had seen doc one time when she moved here but never really established care.   Does follow with obgyn (Dr. Fogleman)  Did see dermatology ([facility], Dr. Gray). Has started to have some hair loss in hairline. Had some bloodwork through them - iron, thyroid, met panel which were all normal.   Does get bloodwork done through work and health screening/eval. This has been normal for her.   Asthma: doesn't keep her from doing activities from day to day. But during allergy season wlil notice it more. Takes singulair which helps. Does have albuterol inhaler but not really using it unless sick or significant allergy flare. In spring had flare after trip to [place]. Was given symbicort inhaler which she used short term at that time. Had been years since on controller inhaler previously.  Allergies: ok control with flonase, singulair, zyrtec. Uses sudafed if needed on days where she has more sinus issues.   Has struggled with weight loss always. Worse since turning 41.   Anxiety: recently had friend who had stroke (younger than her, pregnant, in rehab). Does have xanax which she rarely takes. Doesn't typically have panic attacks, but states that she is more  "type A person" and just gets more easily upset/triggered by things. Maybe will use twice monthly; not been on other medications for anxiety. She does do therapy. Started years ago after recommendation from therapist- more in evening to help with sleep/stress. Getting xanax from Dr. Fogleman. Uses 0.5mg tab and breaks this in half when she uses it.   Past Medical History:  Diagnosis Date  . Allergy   . Asthma    Past Surgical History:  Procedure Laterality Date  . FINGER SURGERY Right    5th digit   No Known Allergies Current Meds  Medication Sig  . CALCIUM PO Take by mouth daily.  . cetirizine (ZYRTEC) 5 MG tablet Take 5 mg by mouth daily.  . Fluticasone Propionate (FLONASE NA) Place into the nose.  . Levonorgestrel (KYLEENA) 19.5 MG IUD by Intrauterine route.  . Montelukast Sodium (SINGULAIR PO) Take by mouth.  . Multiple Vitamin (MULTIVITAMIN WITH MINERALS) TABS tablet Take 1 tablet by mouth daily.   Social History   Tobacco Use  . Smoking status: Never Smoker  . Smokeless tobacco: Never Used  Substance Use Topics  . Alcohol use: Yes   Family History  Problem Relation Age of Onset  . Asthma Father   . Hypertrophic cardiomyopathy Father      Review of Systems  Constitutional: Negative for chills, fatigue and fever.  Respiratory: Negative for cough, chest tightness, shortness of breath and wheezing.   Cardiovascular: Negative for chest pain, palpitations and   leg swelling.  Psychiatric/Behavioral: Positive for sleep disturbance (just when anxious). The patient is nervous/anxious.     Objective:  BP 102/80 (BP Location: Left Arm, Patient Position: Sitting, Cuff Size: Large)   Pulse 68   Temp 97.6 F (36.4 C) (Oral)   Ht 5' 4.5" (1.638 m)   Wt 188 lb 3.2 oz (85.4 kg)   LMP 01/12/2019 (Approximate) Comment: IUD  SpO2 98%   BMI 31.81 kg/m   Weight: 188 lb 3.2 oz (85.4 kg)   BP Readings from Last 3 Encounters:  01/21/19 102/80  06/30/13 122/78   Wt Readings  from Last 3 Encounters:  01/21/19 188 lb 3.2 oz (85.4 kg)  06/30/13 185 lb (83.9 kg)    Physical Exam Constitutional:      General: She is not in acute distress.    Appearance: She is well-developed.  Cardiovascular:     Rate and Rhythm: Normal rate and regular rhythm.     Heart sounds: Normal heart sounds. No murmur. No friction rub.  Pulmonary:     Effort: Pulmonary effort is normal. No respiratory distress.     Breath sounds: Normal breath sounds. No wheezing or rales.  Musculoskeletal:     Right lower leg: No edema.     Left lower leg: No edema.  Neurological:     Mental Status: She is alert and oriented to person, place, and time.  Psychiatric:        Behavior: Behavior normal.     Assessment/Plan  1. Mild intermittent asthma without complication Well-controlled.  Continue as needed albuterol inhaler. - albuterol (VENTOLIN HFA) 108 (90 Base) MCG/ACT inhaler; Inhale 2 puffs into the lungs every 6 (six) hours as needed for wheezing or shortness of breath.  2. Anxiety We discussed some of risks of using benzodiazepines long-term.  Would be ideal if she were able to control anxiety with noncontrolled substances.  Since she usually only takes the Xanax at bedtime to help with anxiety that affects her sleep, it is reasonable to try hydroxyzine.  She will let me know how she does with this. - hydrOXYzine (ATARAX/VISTARIL) 25 MG tablet; Take 0.5-1 tablets (12.5-25 mg total) by mouth daily as needed for anxiety.  Dispense: 30 tablet; Refill: 2  3. Seasonal allergies Controlled.  Continue over-the-counter medications as needed.    Return in about 6 months (around 07/24/2019) for physical exam.   Junell Koberlein, MD    

## 2019-01-26 DIAGNOSIS — F410 Panic disorder [episodic paroxysmal anxiety] without agoraphobia: Secondary | ICD-10-CM | POA: Diagnosis not present

## 2019-01-26 DIAGNOSIS — F419 Anxiety disorder, unspecified: Secondary | ICD-10-CM | POA: Diagnosis not present

## 2019-02-02 DIAGNOSIS — F410 Panic disorder [episodic paroxysmal anxiety] without agoraphobia: Secondary | ICD-10-CM | POA: Diagnosis not present

## 2019-02-02 DIAGNOSIS — F419 Anxiety disorder, unspecified: Secondary | ICD-10-CM | POA: Diagnosis not present

## 2019-02-09 DIAGNOSIS — F419 Anxiety disorder, unspecified: Secondary | ICD-10-CM | POA: Diagnosis not present

## 2019-02-09 DIAGNOSIS — F410 Panic disorder [episodic paroxysmal anxiety] without agoraphobia: Secondary | ICD-10-CM | POA: Diagnosis not present

## 2019-02-13 ENCOUNTER — Other Ambulatory Visit: Payer: Self-pay | Admitting: Family Medicine

## 2019-02-13 DIAGNOSIS — F419 Anxiety disorder, unspecified: Secondary | ICD-10-CM

## 2019-02-16 DIAGNOSIS — F411 Generalized anxiety disorder: Secondary | ICD-10-CM | POA: Diagnosis not present

## 2019-02-19 DIAGNOSIS — Z20828 Contact with and (suspected) exposure to other viral communicable diseases: Secondary | ICD-10-CM | POA: Diagnosis not present

## 2019-02-22 DIAGNOSIS — F411 Generalized anxiety disorder: Secondary | ICD-10-CM | POA: Diagnosis not present

## 2019-02-23 DIAGNOSIS — F411 Generalized anxiety disorder: Secondary | ICD-10-CM | POA: Diagnosis not present

## 2019-03-02 DIAGNOSIS — F411 Generalized anxiety disorder: Secondary | ICD-10-CM | POA: Diagnosis not present

## 2019-03-09 DIAGNOSIS — F411 Generalized anxiety disorder: Secondary | ICD-10-CM | POA: Diagnosis not present

## 2019-03-10 DIAGNOSIS — Z760 Encounter for issue of repeat prescription: Secondary | ICD-10-CM | POA: Diagnosis not present

## 2019-03-10 DIAGNOSIS — J3089 Other allergic rhinitis: Secondary | ICD-10-CM | POA: Diagnosis not present

## 2019-03-16 ENCOUNTER — Telehealth: Payer: Self-pay | Admitting: Family Medicine

## 2019-03-16 ENCOUNTER — Telehealth (INDEPENDENT_AMBULATORY_CARE_PROVIDER_SITE_OTHER): Payer: BC Managed Care – PPO | Admitting: Family Medicine

## 2019-03-16 ENCOUNTER — Other Ambulatory Visit: Payer: Self-pay

## 2019-03-16 DIAGNOSIS — M25512 Pain in left shoulder: Secondary | ICD-10-CM | POA: Diagnosis not present

## 2019-03-16 DIAGNOSIS — M9902 Segmental and somatic dysfunction of thoracic region: Secondary | ICD-10-CM | POA: Diagnosis not present

## 2019-03-16 DIAGNOSIS — M62830 Muscle spasm of back: Secondary | ICD-10-CM | POA: Diagnosis not present

## 2019-03-16 DIAGNOSIS — M542 Cervicalgia: Secondary | ICD-10-CM | POA: Diagnosis not present

## 2019-03-16 DIAGNOSIS — M9903 Segmental and somatic dysfunction of lumbar region: Secondary | ICD-10-CM | POA: Diagnosis not present

## 2019-03-16 DIAGNOSIS — M9905 Segmental and somatic dysfunction of pelvic region: Secondary | ICD-10-CM | POA: Diagnosis not present

## 2019-03-16 MED ORDER — CYCLOBENZAPRINE HCL 5 MG PO TABS: 5.0000 mg | ORAL_TABLET | Freq: Three times a day (TID) | ORAL | 0 refills | Status: DC | PRN

## 2019-03-16 NOTE — Telephone Encounter (Signed)
I called the pt and an appt was scheduled for today at 1:30pm with Dr Volanda Napoleon as Dr Ethlyn Gallery does not have an openings today.
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
## 2019-03-16 NOTE — Telephone Encounter (Signed)
Copied from [place] [id number]. Topic: Appointment Scheduling - Scheduling Inquiry for Clinic >> Mar 16, 2019 12:28 PM Rainey Pines A wrote: Patient would like to be seen today or tomorrow for neck and shoulder pain. Patient would like a callback. Attempted to reach office 3x

## 2019-03-17 DIAGNOSIS — F411 Generalized anxiety disorder: Secondary | ICD-10-CM | POA: Diagnosis not present

## 2019-03-25 DIAGNOSIS — M9905 Segmental and somatic dysfunction of pelvic region: Secondary | ICD-10-CM | POA: Diagnosis not present

## 2019-03-25 DIAGNOSIS — M62830 Muscle spasm of back: Secondary | ICD-10-CM | POA: Diagnosis not present

## 2019-03-25 DIAGNOSIS — M9903 Segmental and somatic dysfunction of lumbar region: Secondary | ICD-10-CM | POA: Diagnosis not present

## 2019-03-25 DIAGNOSIS — M9902 Segmental and somatic dysfunction of thoracic region: Secondary | ICD-10-CM | POA: Diagnosis not present

## 2019-03-30 DIAGNOSIS — F411 Generalized anxiety disorder: Secondary | ICD-10-CM | POA: Diagnosis not present

## 2019-04-09 DIAGNOSIS — F411 Generalized anxiety disorder: Secondary | ICD-10-CM | POA: Diagnosis not present

## 2019-04-14 DIAGNOSIS — M62830 Muscle spasm of back: Secondary | ICD-10-CM | POA: Diagnosis not present

## 2019-04-14 DIAGNOSIS — M9905 Segmental and somatic dysfunction of pelvic region: Secondary | ICD-10-CM | POA: Diagnosis not present

## 2019-04-14 DIAGNOSIS — M9903 Segmental and somatic dysfunction of lumbar region: Secondary | ICD-10-CM | POA: Diagnosis not present

## 2019-04-14 DIAGNOSIS — M9902 Segmental and somatic dysfunction of thoracic region: Secondary | ICD-10-CM | POA: Diagnosis not present

## 2019-04-20 DIAGNOSIS — F411 Generalized anxiety disorder: Secondary | ICD-10-CM | POA: Diagnosis not present

## 2019-04-21 DIAGNOSIS — M9905 Segmental and somatic dysfunction of pelvic region: Secondary | ICD-10-CM | POA: Diagnosis not present

## 2019-04-21 DIAGNOSIS — M62830 Muscle spasm of back: Secondary | ICD-10-CM | POA: Diagnosis not present

## 2019-04-21 DIAGNOSIS — M9902 Segmental and somatic dysfunction of thoracic region: Secondary | ICD-10-CM | POA: Diagnosis not present

## 2019-04-21 DIAGNOSIS — M9903 Segmental and somatic dysfunction of lumbar region: Secondary | ICD-10-CM | POA: Diagnosis not present

## 2019-04-28 DIAGNOSIS — M62830 Muscle spasm of back: Secondary | ICD-10-CM | POA: Diagnosis not present

## 2019-04-28 DIAGNOSIS — M9903 Segmental and somatic dysfunction of lumbar region: Secondary | ICD-10-CM | POA: Diagnosis not present

## 2019-04-28 DIAGNOSIS — M9905 Segmental and somatic dysfunction of pelvic region: Secondary | ICD-10-CM | POA: Diagnosis not present

## 2019-04-28 DIAGNOSIS — M9902 Segmental and somatic dysfunction of thoracic region: Secondary | ICD-10-CM | POA: Diagnosis not present

## 2019-04-29 DIAGNOSIS — F411 Generalized anxiety disorder: Secondary | ICD-10-CM | POA: Diagnosis not present

## 2019-05-04 DIAGNOSIS — F411 Generalized anxiety disorder: Secondary | ICD-10-CM | POA: Diagnosis not present

## 2019-05-11 DIAGNOSIS — F411 Generalized anxiety disorder: Secondary | ICD-10-CM | POA: Diagnosis not present

## 2019-05-13 ENCOUNTER — Ambulatory Visit: Payer: Self-pay | Admitting: *Deleted

## 2019-05-13 DIAGNOSIS — Z711 Person with feared health complaint in whom no diagnosis is made: Secondary | ICD-10-CM | POA: Diagnosis not present

## 2019-05-13 NOTE — Telephone Encounter (Signed)
I returned her call.    I answered her questions pertaining to getting tested for COVID-19 if friend's test result comes back positive or negative.   They have a trip planned this weekend.   I also explained about the 14 day quarantine needed if the friend's test comes back positive. She decided to reschedule the trip to be on the safe side after I answered her questions.  I went over the care advice with her and answered her many questions.  She thanked me for my help.     I let her know to call back for further questions.   Reason for Disposition . COVID-19 Testing, questions about  Answer Assessment - Initial Assessment Questions 1. CLOSE CONTACT: "Who is the person with the confirmed or suspected COVID-19 infection that you were exposed to?"     I'm not 100% sure if exposed.     My husband's friend came over on Sat. For 25 minutes.   My husband was closer to him but I was not near the friend.  2. PLACE of CONTACT: "Where were you when you were exposed to COVID-19?" (e.g., home, school, medical waiting room; which city?)         The friend got tested on Monday and is waiting for his test result to return.   For that short amount of time could we be infected?   This is the 4th day since we were exposed.   We're not having any symptoms.    We are supposed to go visit friends this weekend.    I'm wondering if we should reschedule the trip to be on the safe side.    3. TYPE of CONTACT: "How much contact was there?" (e.g., sitting next to, live in same house, work in same office, same building)     A friend came over Saturday for about 25 minutes.     4. DURATION of CONTACT: "How long were you in contact with the COVID-19 patient?" (e.g., a few seconds, passed by person, a few minutes, live with the patient)     25 minutes  My husband and his friend were looking at baseball cards that the friend brought over. 5. DATE of CONTACT: "When did you have contact with a COVID-19 patient?" (e.g., how  many days ago)     Oct 24  Saturday 2020. 6. TRAVEL: "Have you traveled out of the country recently?" If so, "When and where?"     * Also ask about out-of-state travel, since the CDC has identified some high-risk cities for community spread in the [place].     * Note: Travel becomes less relevant if there is widespread community transmission where the patient lives.     There is widespread community spread here in [place], [place] 7. COMMUNITY SPREAD: "Are there lots of cases of COVID-19 (community spread) where you live?" (See public health department website, if unsure)       Yes 8. SYMPTOMS: "Do you have any symptoms?" (e.g., fever, cough, breathing difficulty)     No symptoms myself or my husband. 9. PREGNANCY OR POSTPARTUM: "Is there any chance you are pregnant?" "When was your last menstrual period?" "Did you deliver in the last 2 weeks?"     Not asked 10. HIGH RISK: "Do you have any heart or lung problems? Do you have a weak immune system?" (e.g., CHF, COPD, asthma, HIV positive, chemotherapy, renal failure, diabetes mellitus, sickle cell anemia)       Not asked  Protocols used: CORONAVIRUS (COVID-19) EXPOSURE-A-AH

## 2019-05-18 DIAGNOSIS — L658 Other specified nonscarring hair loss: Secondary | ICD-10-CM | POA: Diagnosis not present

## 2019-05-18 DIAGNOSIS — F411 Generalized anxiety disorder: Secondary | ICD-10-CM | POA: Diagnosis not present

## 2019-05-19 DIAGNOSIS — Z20828 Contact with and (suspected) exposure to other viral communicable diseases: Secondary | ICD-10-CM | POA: Diagnosis not present

## 2019-05-19 DIAGNOSIS — M9905 Segmental and somatic dysfunction of pelvic region: Secondary | ICD-10-CM | POA: Diagnosis not present

## 2019-05-19 DIAGNOSIS — M9902 Segmental and somatic dysfunction of thoracic region: Secondary | ICD-10-CM | POA: Diagnosis not present

## 2019-05-19 DIAGNOSIS — M62830 Muscle spasm of back: Secondary | ICD-10-CM | POA: Diagnosis not present

## 2019-05-19 DIAGNOSIS — M9903 Segmental and somatic dysfunction of lumbar region: Secondary | ICD-10-CM | POA: Diagnosis not present

## 2019-05-26 DIAGNOSIS — F411 Generalized anxiety disorder: Secondary | ICD-10-CM | POA: Diagnosis not present

## 2019-05-29 DIAGNOSIS — J3089 Other allergic rhinitis: Secondary | ICD-10-CM | POA: Diagnosis not present

## 2019-05-29 DIAGNOSIS — Z760 Encounter for issue of repeat prescription: Secondary | ICD-10-CM | POA: Diagnosis not present

## 2019-06-02 DIAGNOSIS — F411 Generalized anxiety disorder: Secondary | ICD-10-CM | POA: Diagnosis not present

## 2019-06-05 ENCOUNTER — Telehealth (INDEPENDENT_AMBULATORY_CARE_PROVIDER_SITE_OTHER): Payer: BC Managed Care – PPO | Admitting: Family Medicine

## 2019-06-05 DIAGNOSIS — J302 Other seasonal allergic rhinitis: Secondary | ICD-10-CM

## 2019-06-05 NOTE — Progress Notes (Signed)
Virtual Visit via Video Note  I connected with Chelsea Drake on 06/05/19 at  1:30 PM EST by a video enabled telemedicine application 2/2 COVID-19 pandemic and verified that I am speaking with the correct person using two identifiers.  Location patient: home Location provider:work or home office Persons participating in the virtual visit: patient, provider  I discussed the limitations of evaluation and management by telemedicine and the availability of in person appointments. The patient expressed understanding and agreed to proceed.   HPI: Pt has a h/o seasonal allergies. She states on Monday she had a sinus HA.  She endorses .  Feels like she is becoming paranoid, thinking she may have more than allergies.  Taking zyrtec, singulair, flonase regularly and sudafed recently.  Pt denies fever, cough, rhinorrhea, stuffy nose.  Pt states she took hydroxyzine last night, but now feeling drowsy and a little "dry".   ROS: See pertinent positives and negatives per HPI.  Past Medical History:  Diagnosis Date  . Allergy   . Asthma     Past Surgical History:  Procedure Laterality Date  . FINGER SURGERY Right    5th digit    Family History  Problem Relation Age of Onset  . Asthma Father   . Hypertrophic cardiomyopathy Father       Current Outpatient Medications:  .  albuterol (VENTOLIN HFA) 108 (90 Base) MCG/ACT inhaler, Inhale 2 puffs into the lungs every 6 (six) hours as needed for wheezing or shortness of breath., Disp: , Rfl:  .  CALCIUM PO, Take by mouth daily., Disp: , Rfl:  .  cetirizine (ZYRTEC) 5 MG tablet, Take 5 mg by mouth daily., Disp: , Rfl:  .  cyclobenzaprine (FLEXERIL) 5 MG tablet, Take 1 tablet (5 mg total) by mouth 3 (three) times daily as needed for muscle spasms., Disp: 30 tablet, Rfl: 0 .  Fluticasone Propionate (FLONASE NA), Place into the nose., Disp: , Rfl:  .  hydrOXYzine (ATARAX/VISTARIL) 25 MG tablet, TAKE 1/2 -1 TABLETS BY MOUTH DAILY AS NEEDED FOR  ANXIETY., Disp: 90 tablet, Rfl: 1 .  Levonorgestrel (KYLEENA) 19.5 MG IUD, by Intrauterine route., Disp: , Rfl:  .  Montelukast Sodium (SINGULAIR PO), Take by mouth., Disp: , Rfl:  .  Multiple Vitamin (MULTIVITAMIN WITH MINERALS) TABS tablet, Take 1 tablet by mouth daily., Disp: , Rfl:   EXAM:  VITALS per patient if applicable: RR between 12-20 bpm  GENERAL: alert, oriented, appears well and in no acute distress  HEENT: atraumatic, conjunctiva clear, no obvious abnormalities on inspection of external nose and ears  NECK: normal movements of the head and neck  LUNGS: on inspection no signs of respiratory distress, breathing rate appears normal, no obvious gross SOB, gasping or wheezing  CV: no obvious cyanosis  MS: moves all visible extremities without noticeable abnormality  PSYCH/NEURO: pleasant and cooperative, no obvious depression or anxiety, speech and thought processing grossly intact  ASSESSMENT AND PLAN:  Discussed the following assessment and plan:  Seasonal allergies  -Symptoms likely 2/2 allergies.  Also discussed possibility of viral illness including URI, cannot rule out Covid. -Patient advised "dry" feeling likely 2/2 use of multiple antihistamines -pt advised to change allergy med from Zyrtec to allegra, Xyzal, or Claritin to see if she notices an improvement in symptoms -Continue Flonase and Singulair -Discussed using Sudafed sparingly -Patient encouraged to increase p.o. intake of water and consider using saline nasal rinse -Patient given precautions with continued or worsening symptoms. -Patient given information on.  Covid testing  sites.  Follow-up as needed early next week for continued or worsening symptoms.    I discussed the assessment and treatment plan with the patient. The patient was provided an opportunity to ask questions and all were answered. The patient agreed with the plan and demonstrated an understanding of the instructions.   The patient  was advised to call back or seek an in-person evaluation if the symptoms worsen or if the condition fails to improve as anticipated.   Billie Ruddy, MD

## 2019-06-09 DIAGNOSIS — F411 Generalized anxiety disorder: Secondary | ICD-10-CM | POA: Diagnosis not present

## 2019-06-30 DIAGNOSIS — F411 Generalized anxiety disorder: Secondary | ICD-10-CM | POA: Diagnosis not present

## 2019-07-09 DIAGNOSIS — F411 Generalized anxiety disorder: Secondary | ICD-10-CM | POA: Diagnosis not present

## 2019-07-14 DIAGNOSIS — M9903 Segmental and somatic dysfunction of lumbar region: Secondary | ICD-10-CM | POA: Diagnosis not present

## 2019-07-14 DIAGNOSIS — M62830 Muscle spasm of back: Secondary | ICD-10-CM | POA: Diagnosis not present

## 2019-07-14 DIAGNOSIS — M9902 Segmental and somatic dysfunction of thoracic region: Secondary | ICD-10-CM | POA: Diagnosis not present

## 2019-07-14 DIAGNOSIS — M9905 Segmental and somatic dysfunction of pelvic region: Secondary | ICD-10-CM | POA: Diagnosis not present

## 2019-07-16 ENCOUNTER — Ambulatory Visit: Payer: BC Managed Care – PPO | Attending: Internal Medicine

## 2019-07-16 DIAGNOSIS — U071 COVID-19: Secondary | ICD-10-CM | POA: Diagnosis not present

## 2019-07-16 DIAGNOSIS — Z20828 Contact with and (suspected) exposure to other viral communicable diseases: Secondary | ICD-10-CM | POA: Diagnosis not present

## 2019-07-16 DIAGNOSIS — R432 Parageusia: Secondary | ICD-10-CM | POA: Diagnosis not present

## 2019-07-16 DIAGNOSIS — R430 Anosmia: Secondary | ICD-10-CM | POA: Diagnosis not present

## 2019-07-16 DIAGNOSIS — R0981 Nasal congestion: Secondary | ICD-10-CM | POA: Diagnosis not present

## 2019-07-16 DIAGNOSIS — R5383 Other fatigue: Secondary | ICD-10-CM | POA: Diagnosis not present

## 2019-07-20 DIAGNOSIS — U071 COVID-19: Secondary | ICD-10-CM | POA: Diagnosis not present

## 2019-07-20 DIAGNOSIS — R430 Anosmia: Secondary | ICD-10-CM | POA: Diagnosis not present

## 2019-07-20 DIAGNOSIS — Z20822 Contact with and (suspected) exposure to covid-19: Secondary | ICD-10-CM | POA: Diagnosis not present

## 2019-07-20 DIAGNOSIS — R432 Parageusia: Secondary | ICD-10-CM | POA: Diagnosis not present

## 2019-07-24 ENCOUNTER — Encounter: Payer: BC Managed Care – PPO | Admitting: Family Medicine

## 2019-07-27 DIAGNOSIS — F411 Generalized anxiety disorder: Secondary | ICD-10-CM | POA: Diagnosis not present

## 2019-08-04 ENCOUNTER — Other Ambulatory Visit: Payer: Self-pay

## 2019-08-04 ENCOUNTER — Ambulatory Visit: Payer: Self-pay

## 2019-08-04 ENCOUNTER — Telehealth (INDEPENDENT_AMBULATORY_CARE_PROVIDER_SITE_OTHER): Payer: BC Managed Care – PPO | Admitting: Family Medicine

## 2019-08-04 ENCOUNTER — Encounter: Payer: Self-pay | Admitting: Family Medicine

## 2019-08-04 VITALS — HR 72 | Temp 97.0°F

## 2019-08-04 DIAGNOSIS — J4520 Mild intermittent asthma, uncomplicated: Secondary | ICD-10-CM | POA: Diagnosis not present

## 2019-08-04 DIAGNOSIS — R05 Cough: Secondary | ICD-10-CM

## 2019-08-04 DIAGNOSIS — U071 COVID-19: Secondary | ICD-10-CM

## 2019-08-04 DIAGNOSIS — R059 Cough, unspecified: Secondary | ICD-10-CM

## 2019-08-04 MED ORDER — PREDNISONE 20 MG PO TABS: 40.0000 mg | ORAL_TABLET | Freq: Every day | ORAL | 0 refills | Status: DC

## 2019-08-04 MED ORDER — FLUCONAZOLE 150 MG PO TABS: 150.0000 mg | ORAL_TABLET | Freq: Once | ORAL | 0 refills | Status: AC

## 2019-08-04 MED ORDER — DOXYCYCLINE HYCLATE 100 MG PO TABS: 100.0000 mg | ORAL_TABLET | Freq: Two times a day (BID) | ORAL | 0 refills | Status: DC

## 2019-08-04 NOTE — Telephone Encounter (Signed)
Spoke with the pt and offered a virtual visit today with another provider as Dr Hassan Rowan is out of the office.  Patient stated she just wanted to check in with Dr Hassan Rowan and wanted to know if there was anything else she could be doing as she woke up feeling bad, feels she has gone backwards like she was 2 weeks and she was feeling better.  Appt scheduled with Dr Selena Batten today.

## 2019-08-04 NOTE — Telephone Encounter (Signed)
Incoming call from Patient with complaint that she has a cough  Taking Delsym  Cough DM Tested Positive for Covid on January 4 th.  Works from home.  Took a rapid covid test  Result negative.   Denies fever.   Muscle aches.  Hx of asthma uses  inhaler yesterday was tired.  Takes mucinex and  Cough medication.   Has headaches.   Drinks 7 to 8 glasses of water.   Patient questions if she could get covid twice.  Patient wants a return call from Dr.  Hassan Rowan.  Patient state that she does feel a little better.  Just feels discouraged .  If she has covid again.  Recommended that she be tested again to rule out covid again.  Patient not Sure she wants to be tested.  Has been isolating.          Reason for Disposition . [1] MILD longstanding difficulty breathing AND [2]  SAME as normal  Answer Assessment - Initial Assessment Questions 1. RESPIRATORY STATUS: "Describe your breathing?" (e.g., wheezing, shortness of breath, unable to speak, severe coughing)    SOB 2. ONSET: "When did this breathing problem begin?"         This weekend        3. PATTERN "Does the difficult breathing come and go, or has it been constant since it started?"      Comes and goes 4. SEVERITY: "How bad is your breathing?" (e.g., mild, moderate, severe)    - MILD: No SOB at rest, mild SOB with walking, speaks normally in sentences, can lay down, no retractions, pulse < 100.    - MODERATE: SOB at rest, SOB with minimal exertion and prefers to sit, cannot lie down flat, speaks in phrases, mild retractions, audible wheezing, pulse 100-120.    - SEVERE: Very SOB at rest, speaks in single words, struggling to breathe, sitting hunched forward, retractions, pulse > 120      Mild to moderate.   5. RECURRENT SYMPTOM: "Have you had difficulty breathing before?" If so, ask: "When was the last time?" and "What happened that time?"    Yes no cough no breathing.   6. CARDIAC HISTORY: "Do you have any history of heart disease?" (e.g., heart  attack, angina, bypass surgery, angioplasty)     No* 7. LUNG HISTORY: "Do you have any history of lung disease?"  (e.g., pulmonary embolus, asthma, emphysema)     asthma 8. CAUSE: "What do you think is causing the breathing problem?"       9. OTHER SYMPTOMS: "Do you have any other symptoms? (e.g., dizziness, runny nose, cough, chest pain, fever)     Cough,  chest pain from chest pain 10. PREGNANCY: "Is there any chance you are pregnant?" "When was your last menstrual period?"      denies 61. TRAVEL: "Have you traveled out of the country in the last month?" (e.g., travel history, exposures)       denies  Protocols used: BREATHING DIFFICULTY-A-AH

## 2019-08-04 NOTE — Progress Notes (Signed)
Virtual Visit via Video Note  I connected with Chelsea Drake  on 08/04/19 at 11:20 AM EST by a video enabled telemedicine application and verified that I am speaking with the correct person using two identifiers.  Location patient: home Location provider:work or home office Persons participating in the virtual visit: patient, provider  I discussed the limitations of evaluation and management by telemedicine and the availability of in person appointments. The patient expressed understanding and agreed to proceed.   HPI:  Acute visit for cough: -reports diagnosed with a COVID19 on Jan 4th, husband had it and she had loss of taste, sinus symptoms, fatigue, dizziness, cough, symptoms intermittently better -symptoms include:was feeling better but still with cough, lower energy than usual, but has been back to working out and she has had some coughing and some mild sensation in the R chest, drainage in the throat, mucus is green occasionally, frontal sinus discomfort, mild SOB when tries to take a deep breath, anxiety about have symptoms long term from COVID, some achiness, some chills -O2 is 99%, No temp reports normally around 97 and has been around that -isolating/working from home -history of mild asthma per her report (used symbicort in the past with flares for a few months) and bad seasonal allergies, has chronic symptoms on and off with her allergies -she has been using musinex - helps the cough a lot, tylenol, alb prn - at the time of this appt she actually is feeling better -she is working from home and continuing to isolate  -denies: no wheezing, NVD, calf swelling or pain, palpitations, exertional CP, inability to get out of bed -Patient has IUD, denies pregnancy   ROS: See pertinent positives and negatives per HPI.  Past Medical History:  Diagnosis Date  . Allergy   . Asthma     Past Surgical History:  Procedure Laterality Date  . FINGER SURGERY Right    5th digit    Family  History  Problem Relation Age of Onset  . Asthma Father   . Hypertrophic cardiomyopathy Father     SOCIAL HX: see hpi   Current Outpatient Medications:  .  albuterol (VENTOLIN HFA) 108 (90 Base) MCG/ACT inhaler, Inhale 2 puffs into the lungs every 6 (six) hours as needed for wheezing or shortness of breath., Disp: , Rfl:  .  CALCIUM PO, Take by mouth daily., Disp: , Rfl:  .  cetirizine (ZYRTEC) 5 MG tablet, Take 5 mg by mouth daily., Disp: , Rfl:  .  cyclobenzaprine (FLEXERIL) 5 MG tablet, Take 1 tablet (5 mg total) by mouth 3 (three) times daily as needed for muscle spasms., Disp: 30 tablet, Rfl: 0 .  Fluticasone Propionate (FLONASE NA), Place into the nose., Disp: , Rfl:  .  hydrOXYzine (ATARAX/VISTARIL) 25 MG tablet, TAKE 1/2 -1 TABLETS BY MOUTH DAILY AS NEEDED FOR ANXIETY., Disp: 90 tablet, Rfl: 1 .  Levonorgestrel (KYLEENA) 19.5 MG IUD, by Intrauterine route., Disp: , Rfl:  .  Montelukast Sodium (SINGULAIR PO), Take by mouth., Disp: , Rfl:  .  Multiple Vitamin (MULTIVITAMIN WITH MINERALS) TABS tablet, Take 1 tablet by mouth daily., Disp: , Rfl:  .  doxycycline (VIBRA-TABS) 100 MG tablet, Take 1 tablet (100 mg total) by mouth 2 (two) times daily., Disp: 14 tablet, Rfl: 0 .  predniSONE (DELTASONE) 20 MG tablet, Take 2 tablets (40 mg total) by mouth daily with breakfast., Disp: 8 tablet, Rfl: 0  EXAM:  VITALS per patient if applicable: see hpi  GENERAL: alert, oriented, appears well  and in no acute distress  HEENT: atraumatic, conjunttiva clear, no obvious abnormalities on inspection of external nose and ears  NECK: normal movements of the head and neck  LUNGS: on inspection no signs of respiratory distress, breathing rate appears normal, no obvious gross SOB, gasping or wheezing  CV: no obvious cyanosis  MS: moves all visible extremities without noticeable abnormality  PSYCH/NEURO: pleasant and cooperative, no obvious depression or anxiety, speech and thought processing  grossly intact  ASSESSMENT AND PLAN:  Discussed the following assessment and plan:  COVID-19  Mild intermittent asthma without complication  Cough  -we discussed possible serious and likely etiologies, options for evaluation and workup, limitations of telemedicine visit vs in person visit, treatment, treatment risks and precautions. Pt prefers to treat via telemedicine empirically rather then risking or undertaking an in person visit at this moment. Query ongoing COVID19 symptoms, asthma exacerbation, CAP vs other. She opted to try allergy regimen, prn alb, doxy given duration of symptoms and sinus pain and green mucus, prednisone with close follow up. She has CPE with PCP on Friday and has asked to change this to a virtual follow up - sent message to schedulers to assist. Also, discussed other heart and lung complications of [id number] and pt agrees to seek prompt in person care if worsening, new symptoms arise, or if is not improving with treatment. She requests diflucan for yeast as reports always requires when uses abx. Continue isolation until feeling better.   I discussed the assessment and treatment plan with the patient. The patient was provided an opportunity to ask questions and all were answered. The patient agreed with the plan and demonstrated an understanding of the instructions.   The patient was advised to call back or seek an in-person evaluation if the symptoms worsen or if the condition fails to improve as anticipated.   Lucretia Kern, DO

## 2019-08-07 ENCOUNTER — Telehealth (INDEPENDENT_AMBULATORY_CARE_PROVIDER_SITE_OTHER): Payer: BC Managed Care – PPO | Admitting: Family Medicine

## 2019-08-07 ENCOUNTER — Ambulatory Visit (INDEPENDENT_AMBULATORY_CARE_PROVIDER_SITE_OTHER): Payer: BC Managed Care – PPO

## 2019-08-07 ENCOUNTER — Encounter: Payer: Self-pay | Admitting: Family Medicine

## 2019-08-07 ENCOUNTER — Other Ambulatory Visit: Payer: Self-pay

## 2019-08-07 ENCOUNTER — Telehealth: Payer: Self-pay | Admitting: Family Medicine

## 2019-08-07 ENCOUNTER — Other Ambulatory Visit (INDEPENDENT_AMBULATORY_CARE_PROVIDER_SITE_OTHER): Payer: BC Managed Care – PPO

## 2019-08-07 VITALS — Wt 188.0 lb

## 2019-08-07 DIAGNOSIS — R0602 Shortness of breath: Secondary | ICD-10-CM

## 2019-08-07 DIAGNOSIS — J4520 Mild intermittent asthma, uncomplicated: Secondary | ICD-10-CM | POA: Diagnosis not present

## 2019-08-07 LAB — CBC WITH DIFFERENTIAL/PLATELET
Basophils Absolute: 0.1 10*3/uL (ref 0.0–0.1)
Basophils Relative: 0.6 % (ref 0.0–3.0)
Eosinophils Absolute: 0.1 10*3/uL (ref 0.0–0.7)
Eosinophils Relative: 1.3 % (ref 0.0–5.0)
HCT: 39 % (ref 36.0–46.0)
Hemoglobin: 12.9 g/dL (ref 12.0–15.0)
Lymphocytes Relative: 29.3 % (ref 12.0–46.0)
Lymphs Abs: 3.3 10*3/uL (ref 0.7–4.0)
MCHC: 33.1 g/dL (ref 30.0–36.0)
MCV: 90.7 fl (ref 78.0–100.0)
Monocytes Absolute: 0.5 10*3/uL (ref 0.1–1.0)
Monocytes Relative: 4.5 % (ref 3.0–12.0)
Neutro Abs: 7.2 10*3/uL (ref 1.4–7.7)
Neutrophils Relative %: 64.3 % (ref 43.0–77.0)
Platelets: 506 10*3/uL — ABNORMAL HIGH (ref 150.0–400.0)
RBC: 4.3 Mil/uL (ref 3.87–5.11)
RDW: 12.8 % (ref 11.5–15.5)
WBC: 11.3 10*3/uL — ABNORMAL HIGH (ref 4.0–10.5)

## 2019-08-07 LAB — COMPREHENSIVE METABOLIC PANEL
ALT: 13 U/L (ref 0–35)
AST: 18 U/L (ref 0–37)
Albumin: 4.4 g/dL (ref 3.5–5.2)
Alkaline Phosphatase: 53 U/L (ref 39–117)
BUN: 18 mg/dL (ref 6–23)
CO2: 27 mEq/L (ref 19–32)
Calcium: 10.2 mg/dL (ref 8.4–10.5)
Chloride: 101 mEq/L (ref 96–112)
Creatinine, Ser: 0.71 mg/dL (ref 0.40–1.20)
GFR: 90.19 mL/min (ref >60.00)
Glucose, Bld: 89 mg/dL (ref 70–99)
Potassium: 4.3 mEq/L (ref 3.5–5.1)
Sodium: 137 mEq/L (ref 135–145)
Total Bilirubin: 0.3 mg/dL (ref 0.2–1.2)
Total Protein: 7.6 g/dL (ref 6.0–8.3)

## 2019-08-07 LAB — BRAIN NATRIURETIC PEPTIDE: Pro B Natriuretic peptide (BNP): 36 pg/mL (ref 0.0–100.0)

## 2019-08-07 MED ORDER — AEROCHAMBER PLUS FLO-VU SMALL MISC: 1.0000 | Freq: Once | 0 refills | Status: AC

## 2019-08-07 NOTE — Telephone Encounter (Signed)
Due to breathing issues, and relative who is cardiologist out of state wanting her to get d-dimer test done stat.  Patient has a virtual appointment this afternoon, but she is wanting to have a call back before her appointment.  She was informed that with the symptoms she was having she couldn't come into the office. Patient is awaiting a call back.

## 2019-08-07 NOTE — Progress Notes (Signed)
Virtual Visit via Video Note  I connected with Chelsea Drake  on 08/08/19 at  2:00 PM EST by a video enabled telemedicine application and verified that I am speaking with the correct person using two identifiers.  Location patient: home Location provider:work or home office Persons participating in the virtual visit: patient, provider  I discussed the limitations of evaluation and management by telemedicine and the availability of in person appointments. The patient expressed understanding and agreed to proceed.   Chelsea Drake DOB: 02-09-1976 Encounter date: 08/07/2019  This is a 44 y.o. female who presents with Chief Complaint  Patient presents with  . Shortness of Breath    intermittent x6 days  . Cough    non-productive x2-3 days ago  . Fatigue    History of present illness: Tested positive for COVID about 1/4 with exposure on 12/26th. She tested negative on 1/31 and then on 1/4 woke up with no taste or smell. Had another test to know for certain. Had 4 days of exhaustion after that but no other symptoms like allergy/sinus. No fever. No breathing problems. On 1/29 started taking steroid inhaler, so has been taking this consistently 2 puffs BID since 1/29 (just to be proactive). By weekend after 4th and since felt better. Still sleeping a little more at night. Past Monday in middle of night woke up achy, chills, cough, shortness of breathe and a little pain right side of chest. More like feeling that she was having a harder time getting full breath. Feels like right behind right breast - just feels a little strained. Times she feels it when she breathes. Not intense chest pain. Goes through periods of shortness of breath. Inhaler not seeming to help (albuterol). Woke up Tuesday feeling all these things. Did virtual visit with Dr. Selena Batten. Small green phlegm. Put on doxy. Weds, Thursday, today feels better. Still consistently she is going through periods where she has trouble breathing  (no like going to ER); lasts anywhere from 20 minutes-hour and a half. On and off tired.   Yesterday had to lay down after going to grocery store. Felt like it was hard to breathe in mask at store.   Since Monday nights have been better. Achy in morning. Still not fever. Most concerning thing was this morning 5-6 was worst breathing she has had - low readings this morning - improved with sitting up to 100%. Whole month this morning was the first time she really thought she might have to go to hosp. 85% but then all rechecks in any positions were 95 or higher. Mucinex and robitussin are only things that are giving her some relief from congestion in chest.   Robitussin made her feel better.   Episodes coming in late afternoon or early evening.    No Known Allergies Current Meds  Medication Sig  . albuterol (VENTOLIN HFA) 108 (90 Base) MCG/ACT inhaler Inhale 2 puffs into the lungs every 6 (six) hours as needed for wheezing or shortness of breath.  . budesonide-formoterol (SYMBICORT) 80-4.5 MCG/ACT inhaler Inhale 2 puffs into the lungs 2 (two) times daily.  Marland Kitchen CALCIUM PO Take by mouth daily.  . cetirizine (ZYRTEC) 5 MG tablet Take 5 mg by mouth daily.  . cyclobenzaprine (FLEXERIL) 5 MG tablet Take 1 tablet (5 mg total) by mouth 3 (three) times daily as needed for muscle spasms.  Marland Kitchen doxycycline (VIBRA-TABS) 100 MG tablet Take 1 tablet (100 mg total) by mouth 2 (two) times daily.  . Fluticasone Propionate (  FLONASE NA) Place into the nose.  . hydrOXYzine (ATARAX/VISTARIL) 25 MG tablet TAKE 1/2 -1 TABLETS BY MOUTH DAILY AS NEEDED FOR ANXIETY.  . Levonorgestrel (KYLEENA) 19.5 MG IUD by Intrauterine route.  . Montelukast Sodium (SINGULAIR PO) Take by mouth.  . Multiple Vitamin (MULTIVITAMIN WITH MINERALS) TABS tablet Take 1 tablet by mouth daily.  . [DISCONTINUED] predniSONE (DELTASONE) 20 MG tablet Take 2 tablets (40 mg total) by mouth daily with breakfast.    Review of Systems  Constitutional:  Positive for fatigue. Negative for chills and fever.  HENT: Negative for congestion, sinus pressure and sore throat.   Respiratory: Positive for cough and shortness of breath. Negative for chest tightness and wheezing.   Cardiovascular: Positive for chest pain. Negative for palpitations and leg swelling.    Objective:  Wt 188 lb (85.3 kg)   BMI 31.77 kg/m   Weight: 188 lb (85.3 kg)   BP Readings from Last 3 Encounters:  01/21/19 102/80  06/30/13 122/78   Wt Readings from Last 3 Encounters:  08/07/19 188 lb (85.3 kg)  01/21/19 188 lb 3.2 oz (85.4 kg)  06/30/13 185 lb (83.9 kg)    EXAM:  GENERAL: alert, oriented, appears well and in no acute distress  HEENT: atraumatic, conjunctiva clear, no obvious abnormalities on inspection of external nose and ears  NECK: normal movements of the head and neck  LUNGS: on inspection no signs of respiratory distress, breathing rate appears normal, no obvious gross SOB, gasping or wheezing  CV: no obvious cyanosis  MS: moves all visible extremities without noticeable abnormality  PSYCH/NEURO: pleasant and cooperative, no obvious depression or anxiety, speech and thought processing grossly intact   Assessment/Plan  1. Shortness of breath She has discussed her case with some family members who are physicians and patient herself is worried that there is something additional going on outside of post Covid cough and asthma.  She would like to avoid prednisone, although she does have this at home.  We are going to get a chest x-ray and D-dimer for further evaluation today.  She knows if oxygen level drops back into the 80s her breathing worsens, that she needs to proceed to the ER.  I have advised her to increase her Symbicort to 4 puffs twice daily (max dose).  I sent in a spacer for her to use with her albuterol inhaler to hopefully get better response with this.  I gave her instructions on how to use it.  Continue with Mucinex and Robitussin for  symptom relief.  We will check in with her as we get results back. - CBC with Differential/Platelet; Future - Comprehensive metabolic panel; Future - DG Chest 2 View; Future - Brain natriuretic peptide; Future - D-dimer, quantitative (not at [facility]); Future  2. Mild intermittent asthma without complication See above.      I discussed the assessment and treatment plan with the patient. The patient was provided an opportunity to ask questions and all were answered. The patient agreed with the plan and demonstrated an understanding of the instructions.   The patient was advised to call back or seek an in-person evaluation if the symptoms worsen or if the condition fails to improve as anticipated.  I provided 28 minutes of non-face-to-face time during this encounter.   Micheline Rough, MD

## 2019-08-07 NOTE — Addendum Note (Signed)
Addended by: Philemon Kingdom on: 08/07/2019 04:14 PM   Modules accepted: Orders

## 2019-08-09 ENCOUNTER — Encounter: Payer: Self-pay | Admitting: Family Medicine

## 2019-08-10 ENCOUNTER — Other Ambulatory Visit: Payer: BC Managed Care – PPO

## 2019-08-10 ENCOUNTER — Telehealth: Payer: Self-pay

## 2019-08-10 ENCOUNTER — Other Ambulatory Visit: Payer: Self-pay | Admitting: Family Medicine

## 2019-08-10 DIAGNOSIS — R0602 Shortness of breath: Secondary | ICD-10-CM

## 2019-08-10 DIAGNOSIS — R079 Chest pain, unspecified: Secondary | ICD-10-CM

## 2019-08-10 LAB — D-DIMER, QUANTITATIVE: D-Dimer, Quant: 0.2 mcg/mL FEU (ref <0.50)

## 2019-08-10 NOTE — Telephone Encounter (Signed)
Can you please let Yana know. We were waiting all weekend for that result, unfortunately. At this point, we don't have lab here today. I think she would be willing to go somewhere else to have it drawn and I would be happy to send a stat order today to complete at other lab? Can you help arrange this for her and apologize for this? I think that Kriya is feeling a little better, but still having some chest tightness. I also saw the message she had sent me. I still think that the easiest next step is getting a d dimer to determine what to do next. If we can get this done this morning, we should have result by tomorrow (or this evening)  Please CC Aneyah as well so she is aware for future orders.

## 2019-08-10 NOTE — Telephone Encounter (Signed)
The patient is wanting to follow up with you from the conversation that you had with the patient earlier.  Please follow up with the patient.  Thank you

## 2019-08-10 NOTE — Telephone Encounter (Signed)
Patient seen for virtual visit instead

## 2019-08-10 NOTE — Telephone Encounter (Signed)
Noted  

## 2019-08-10 NOTE — Telephone Encounter (Signed)
Spoke with Raheem at [facility] and he transferred me to the lab manager Velna Hatchet.  Velna Hatchet stated the sample was sent as a frozen serum and it should have been sent as a citrated plasma-incorrect sample received. Message sent to PCP.

## 2019-08-10 NOTE — Telephone Encounter (Signed)
Now that I'm looking at the lab it looks like my priority was changed on the d dimer, but still surprised I don't have result yet.   Can you check with lab and see if they have result for d dimer? Thanks!

## 2019-08-10 NOTE — Telephone Encounter (Signed)
Spoke with patient and she is feeling tremendously better today.   She has continued the symbicort as 4 puffs BID; she is getting better relief from albuterol with spacer and doing 4 puffs. She has different symptoms every day sometimes more weigh than others. Fatigue is better. Yesterday chest discomfort more central. Felt better being out in fresh air yesterday. Started baby aspirin on Friday.   We will get in touch with her once d dimer is back. We discussed follow up pending this result.

## 2019-08-10 NOTE — Telephone Encounter (Signed)
I called the lab at [facility] to be sure it would be OK to have the pt come to their lab or the respiratory clinic for testing.  Per Clydie Braun at the [facility] lab, she stated Aram Beecham the Personal assistant agreed to have the pt come in at 12:15pm today.  I called the pt and informed her of the message below and she agreed to go to the [place] lab at 12:15pm.  Patient stated she wanted to talk with someone about her experience in the lab here and I transferred the call to the office manager.

## 2019-08-11 DIAGNOSIS — F411 Generalized anxiety disorder: Secondary | ICD-10-CM | POA: Diagnosis not present

## 2019-08-11 LAB — D-DIMER, QUANTITATIVE

## 2019-08-11 LAB — SPECIMEN STATUS REPORT

## 2019-08-16 DIAGNOSIS — J029 Acute pharyngitis, unspecified: Secondary | ICD-10-CM | POA: Diagnosis not present

## 2019-08-17 ENCOUNTER — Institutional Professional Consult (permissible substitution): Payer: Self-pay | Admitting: Pulmonary Disease

## 2019-08-18 DIAGNOSIS — F411 Generalized anxiety disorder: Secondary | ICD-10-CM | POA: Diagnosis not present

## 2019-08-25 DIAGNOSIS — F411 Generalized anxiety disorder: Secondary | ICD-10-CM | POA: Diagnosis not present

## 2019-08-26 ENCOUNTER — Encounter: Payer: Self-pay | Admitting: Family Medicine

## 2019-08-26 DIAGNOSIS — Z01419 Encounter for gynecological examination (general) (routine) without abnormal findings: Secondary | ICD-10-CM | POA: Diagnosis not present

## 2019-08-26 DIAGNOSIS — Z1231 Encounter for screening mammogram for malignant neoplasm of breast: Secondary | ICD-10-CM | POA: Diagnosis not present

## 2019-08-26 DIAGNOSIS — Z6833 Body mass index (BMI) 33.0-33.9, adult: Secondary | ICD-10-CM | POA: Diagnosis not present

## 2019-09-02 DIAGNOSIS — F411 Generalized anxiety disorder: Secondary | ICD-10-CM | POA: Diagnosis not present

## 2019-09-08 DIAGNOSIS — F411 Generalized anxiety disorder: Secondary | ICD-10-CM | POA: Diagnosis not present

## 2019-09-13 DIAGNOSIS — Z713 Dietary counseling and surveillance: Secondary | ICD-10-CM | POA: Diagnosis not present

## 2019-09-14 ENCOUNTER — Telehealth: Payer: Self-pay | Admitting: Family Medicine

## 2019-09-14 NOTE — Telephone Encounter (Signed)
Left a detailed message for the pt to call back and leave a detailed message as to what her questions are regarding Symbicort.

## 2019-09-14 NOTE — Telephone Encounter (Signed)
Pt want a call to talk about her medication   budesonide-formoterol (SYMBICORT)

## 2019-09-15 ENCOUNTER — Other Ambulatory Visit: Payer: Self-pay | Admitting: Family Medicine

## 2019-09-15 NOTE — Telephone Encounter (Signed)
Pt called in and stated the Symbicort is suppose to be 4 puff 2 times a day when she last spoke to [facility].  She needs it refilled for the 4 puff 2 times a day.    Pharmacy: [facility] [address]  FAX: [phone]   Pt can be reached at [phone]

## 2019-09-17 ENCOUNTER — Other Ambulatory Visit: Payer: Self-pay | Admitting: Family Medicine

## 2019-09-17 MED ORDER — BUDESONIDE-FORMOTEROL FUMARATE 160-4.5 MCG/ACT IN AERO: 2.0000 | INHALATION_SPRAY | Freq: Two times a day (BID) | RESPIRATORY_TRACT | 3 refills | Status: DC

## 2019-09-17 NOTE — Telephone Encounter (Signed)
I switched the inhaler to a so she can just do 2 puffs twice daily. I have sent a new  rx for this to her pharmacy this morning.

## 2019-09-17 NOTE — Telephone Encounter (Signed)
Spoke with the pt and informed her of the message below.   

## 2019-09-17 NOTE — Telephone Encounter (Signed)
See prior phone note. 

## 2019-09-22 DIAGNOSIS — F411 Generalized anxiety disorder: Secondary | ICD-10-CM | POA: Diagnosis not present

## 2019-09-25 ENCOUNTER — Encounter: Payer: Self-pay | Admitting: Family Medicine

## 2019-09-29 DIAGNOSIS — F411 Generalized anxiety disorder: Secondary | ICD-10-CM | POA: Diagnosis not present

## 2019-10-02 DIAGNOSIS — Z713 Dietary counseling and surveillance: Secondary | ICD-10-CM | POA: Diagnosis not present

## 2019-10-06 DIAGNOSIS — F411 Generalized anxiety disorder: Secondary | ICD-10-CM | POA: Diagnosis not present

## 2019-10-12 DIAGNOSIS — H25013 Cortical age-related cataract, bilateral: Secondary | ICD-10-CM | POA: Diagnosis not present

## 2019-10-13 DIAGNOSIS — F411 Generalized anxiety disorder: Secondary | ICD-10-CM | POA: Diagnosis not present

## 2019-10-20 DIAGNOSIS — J3089 Other allergic rhinitis: Secondary | ICD-10-CM | POA: Insufficient documentation

## 2019-10-20 DIAGNOSIS — L659 Nonscarring hair loss, unspecified: Secondary | ICD-10-CM | POA: Insufficient documentation

## 2019-10-20 DIAGNOSIS — L710 Perioral dermatitis: Secondary | ICD-10-CM | POA: Insufficient documentation

## 2019-10-20 DIAGNOSIS — J4540 Moderate persistent asthma, uncomplicated: Secondary | ICD-10-CM | POA: Insufficient documentation

## 2019-10-20 DIAGNOSIS — F411 Generalized anxiety disorder: Secondary | ICD-10-CM | POA: Diagnosis not present

## 2019-10-27 DIAGNOSIS — Z713 Dietary counseling and surveillance: Secondary | ICD-10-CM | POA: Diagnosis not present

## 2019-10-27 DIAGNOSIS — F411 Generalized anxiety disorder: Secondary | ICD-10-CM | POA: Diagnosis not present

## 2019-11-03 DIAGNOSIS — F411 Generalized anxiety disorder: Secondary | ICD-10-CM | POA: Diagnosis not present

## 2019-11-10 DIAGNOSIS — F411 Generalized anxiety disorder: Secondary | ICD-10-CM | POA: Diagnosis not present

## 2019-11-10 NOTE — Progress Notes (Signed)
Chelsea Drake DOB: Dec 07, 1975 Encounter date: 11/11/2019  This is a 44 y.o. female who presents for complete physical   History of present illness/Additional concerns: Asthma: Tried to decrease symbicort and felt like breathing worsened and needed to use albuterol inhaler more so increased back to 2 puffs BID. Then 2 weeks ago went to [facility] and forgot to use inhaler and did fine with breathing. Came back on the 22nd nad has had to use rescue inhaler 1 time in last week. Has done ok since then.   Seasonal allergies: haven't been great. Using flonase, singulair, otc allergy eye drops, taking sudafed, zyrtec. Not interested in seeing allergy at this time.  Anxiety: stable. Occasionally uses med at night to help with sleep.   Has always been up and down with weight. Does well with disciplined programs - structured. Has been meeting with nutritionist for 8 weeks now. Has gone up and down with 25lbs repeatedly. Logging meals. Focusing on balance and health. Has been doing this for 8 weeks. Getting frustrated without seeing results. Likes sweets but has really cut out sweets and rarely is doing less healthy choices. Does peloton - 35-45 minutes 5 days/week. 2 weeks ago started weight training.   Gyn: fogleman Derm: [facility] eye doc last week.  Follows regularly with dentist.   Past Medical History:  Diagnosis Date  . Allergy   . Asthma    Past Surgical History:  Procedure Laterality Date  . FINGER SURGERY Right    5th digit   No Known Allergies Current Meds  Medication Sig  . albuterol (VENTOLIN HFA) 108 (90 Base) MCG/ACT inhaler Inhale 2 puffs into the lungs every 6 (six) hours as needed for wheezing or shortness of breath.  . budesonide-formoterol (SYMBICORT) 160-4.5 MCG/ACT inhaler Inhale 1 puff into the lungs 2 (two) times daily.  Marland Kitchen CALCIUM PO Take by mouth daily.  . cetirizine (ZYRTEC) 5 MG tablet Take 5 mg by mouth daily.  . cyclobenzaprine (FLEXERIL) 5 MG tablet Take 1 tablet (5  mg total) by mouth 3 (three) times daily as needed for muscle spasms.  . Fluticasone Propionate (FLONASE NA) Place into the nose.  . hydrOXYzine (ATARAX/VISTARIL) 25 MG tablet TAKE 1/2 -1 TABLETS BY MOUTH DAILY AS NEEDED FOR ANXIETY.  . Levonorgestrel (KYLEENA) 19.5 MG IUD by Intrauterine route.  . Montelukast Sodium (SINGULAIR PO) Take by mouth.  . Multiple Vitamin (MULTIVITAMIN WITH MINERALS) TABS tablet Take 1 tablet by mouth daily.  . [DISCONTINUED] budesonide-formoterol (SYMBICORT) 160-4.5 MCG/ACT inhaler Inhale 2 puffs into the lungs 2 (two) times daily.  . [DISCONTINUED] budesonide-formoterol (SYMBICORT) 80-4.5 MCG/ACT inhaler Inhale 2 puffs into the lungs 2 (two) times daily.  . [DISCONTINUED] doxycycline (VIBRA-TABS) 100 MG tablet Take 1 tablet (100 mg total) by mouth 2 (two) times daily.   Social History   Tobacco Use  . Smoking status: Never Smoker  . Smokeless tobacco: Never Used  Substance Use Topics  . Alcohol use: Yes   Family History  Problem Relation Age of Onset  . Asthma Father   . Hypertrophic cardiomyopathy Father      Review of Systems  Constitutional: Negative for activity change, appetite change, chills, fatigue, fever and unexpected weight change.  HENT: Negative for congestion, ear pain, hearing loss, sinus pressure, sinus pain, sore throat and trouble swallowing.   Eyes: Negative for pain and visual disturbance.  Respiratory: Negative for cough, chest tightness, shortness of breath and wheezing.   Cardiovascular: Negative for chest pain, palpitations and leg swelling.  Gastrointestinal: Negative for abdominal pain, blood in stool, constipation, diarrhea, nausea and vomiting.  Genitourinary: Negative for difficulty urinating and menstrual problem.  Musculoskeletal: Negative for arthralgias and back pain.  Skin: Negative for rash.  Neurological: Negative for dizziness, weakness, numbness and headaches.  Hematological: Negative for adenopathy. Does not  bruise/bleed easily.  Psychiatric/Behavioral: Negative for sleep disturbance and suicidal ideas. The patient is not nervous/anxious.     CBC:  Lab Results  Component Value Date   WBC 11.3 (H) 08/07/2019   HGB 12.9 08/07/2019   HCT 39.0 08/07/2019   MCHC 33.1 08/07/2019   RDW 12.8 08/07/2019   PLT 506.0 (H) 08/07/2019   CMP: Lab Results  Component Value Date   NA 137 08/07/2019   K 4.3 08/07/2019   CL 101 08/07/2019   CO2 27 08/07/2019   GLUCOSE 89 08/07/2019   BUN 18 08/07/2019   CREATININE 0.71 08/07/2019   CALCIUM 10.2 08/07/2019   PROT 7.6 08/07/2019   BILITOT 0.3 08/07/2019   ALKPHOS 53 08/07/2019   ALT 13 08/07/2019   AST 18 08/07/2019   LIPID:No results found for: CHOL, TRIG, HDL, LDLCALC, LABVLDL  Objective:  BP 112/90 (BP Location: Left Arm, Patient Position: Sitting, Cuff Size: Large)   Pulse 79   Temp 98 F (36.7 C) (Temporal)   Ht 5' 4.5" (1.638 m)   Wt 196 lb 1.6 oz (89 kg)   SpO2 96%   BMI 33.14 kg/m   Weight: 196 lb 1.6 oz (89 kg)   BP Readings from Last 3 Encounters:  11/11/19 112/90  01/21/19 102/80  06/30/13 122/78   Wt Readings from Last 3 Encounters:  11/11/19 196 lb 1.6 oz (89 kg)  08/07/19 188 lb (85.3 kg)  01/21/19 188 lb 3.2 oz (85.4 kg)    Physical Exam Constitutional:      General: She is not in acute distress.    Appearance: She is well-developed.  HENT:     Head: Normocephalic and atraumatic.     Right Ear: External ear normal.     Left Ear: External ear normal.     Mouth/Throat:     Pharynx: No oropharyngeal exudate.  Eyes:     Conjunctiva/sclera: Conjunctivae normal.     Pupils: Pupils are equal, round, and reactive to light.  Neck:     Thyroid: No thyromegaly.  Cardiovascular:     Rate and Rhythm: Normal rate and regular rhythm.     Heart sounds: Normal heart sounds. No murmur. No friction rub. No gallop.   Pulmonary:     Effort: Pulmonary effort is normal.     Breath sounds: Normal breath sounds.  Abdominal:      General: Bowel sounds are normal. There is no distension.     Palpations: Abdomen is soft. There is no mass.     Tenderness: There is no abdominal tenderness. There is no guarding.     Hernia: No hernia is present.  Musculoskeletal:        General: No tenderness or deformity. Normal range of motion.     Cervical back: Normal range of motion and neck supple.  Lymphadenopathy:     Cervical: No cervical adenopathy.  Skin:    General: Skin is warm and dry.     Findings: No rash.  Neurological:     Mental Status: She is alert and oriented to person, place, and time.     Deep Tendon Reflexes: Reflexes normal.     Reflex Scores:  Tricep reflexes are 2+ on the right side and 2+ on the left side.      Bicep reflexes are 2+ on the right side and 2+ on the left side.      Brachioradialis reflexes are 2+ on the right side and 2+ on the left side.      Patellar reflexes are 2+ on the right side and 2+ on the left side. Psychiatric:        Speech: Speech normal.        Behavior: Behavior normal.        Thought Content: Thought content normal.     Assessment/Plan: Health Maintenance Due  Topic Date Due  . TETANUS/TDAP  Never done  . COVID-19 Vaccine (2 - Pfizer 2-dose series) 11/12/2019   Health Maintenance reviewed.  1. Preventative health care Keep up with healthy eating and regular exercise. We are going to discuss weight loss options in more detail after she provides bloodwork.   2. Moderate persistent asthma without complication Significantly improved breathing overall.   3. Anxiety Stable. Currently not taking medication.  4. Class 1 obesity without serious comorbidity with body mass index (BMI) of 33.0 to 33.9 in adult, unspecified obesity type She had some bloodwork done previously and would like to look into other causes for difficulty with weight loss like insulin resistance. She is going to bring these lab results to me for review and we will order additional  bloodwork/discuss options pending this review.   Return for pending lab review.  Theodis Shove, MD

## 2019-11-11 ENCOUNTER — Encounter: Payer: Self-pay | Admitting: Family Medicine

## 2019-11-11 ENCOUNTER — Other Ambulatory Visit: Payer: Self-pay

## 2019-11-11 ENCOUNTER — Ambulatory Visit (INDEPENDENT_AMBULATORY_CARE_PROVIDER_SITE_OTHER): Payer: BC Managed Care – PPO | Admitting: Family Medicine

## 2019-11-11 VITALS — BP 112/90 | HR 79 | Temp 98.0°F | Ht 64.5 in | Wt 196.1 lb

## 2019-11-11 DIAGNOSIS — E669 Obesity, unspecified: Secondary | ICD-10-CM

## 2019-11-11 DIAGNOSIS — F419 Anxiety disorder, unspecified: Secondary | ICD-10-CM

## 2019-11-11 DIAGNOSIS — Z0000 Encounter for general adult medical examination without abnormal findings: Secondary | ICD-10-CM | POA: Diagnosis not present

## 2019-11-11 DIAGNOSIS — J4540 Moderate persistent asthma, uncomplicated: Secondary | ICD-10-CM

## 2019-11-11 DIAGNOSIS — Z6833 Body mass index (BMI) 33.0-33.9, adult: Secondary | ICD-10-CM

## 2019-11-11 MED ORDER — BUDESONIDE-FORMOTEROL FUMARATE 160-4.5 MCG/ACT IN AERO: 1.0000 | INHALATION_SPRAY | Freq: Two times a day (BID) | RESPIRATORY_TRACT | 3 refills | Status: DC

## 2019-11-17 DIAGNOSIS — F411 Generalized anxiety disorder: Secondary | ICD-10-CM | POA: Diagnosis not present

## 2019-11-24 DIAGNOSIS — M9903 Segmental and somatic dysfunction of lumbar region: Secondary | ICD-10-CM | POA: Diagnosis not present

## 2019-11-24 DIAGNOSIS — F411 Generalized anxiety disorder: Secondary | ICD-10-CM | POA: Diagnosis not present

## 2019-11-24 DIAGNOSIS — M9902 Segmental and somatic dysfunction of thoracic region: Secondary | ICD-10-CM | POA: Diagnosis not present

## 2019-11-24 DIAGNOSIS — M9905 Segmental and somatic dysfunction of pelvic region: Secondary | ICD-10-CM | POA: Diagnosis not present

## 2019-11-24 DIAGNOSIS — M62830 Muscle spasm of back: Secondary | ICD-10-CM | POA: Diagnosis not present

## 2019-11-27 DIAGNOSIS — Z713 Dietary counseling and surveillance: Secondary | ICD-10-CM | POA: Diagnosis not present

## 2019-12-01 DIAGNOSIS — M62830 Muscle spasm of back: Secondary | ICD-10-CM | POA: Diagnosis not present

## 2019-12-01 DIAGNOSIS — M9903 Segmental and somatic dysfunction of lumbar region: Secondary | ICD-10-CM | POA: Diagnosis not present

## 2019-12-01 DIAGNOSIS — M9905 Segmental and somatic dysfunction of pelvic region: Secondary | ICD-10-CM | POA: Diagnosis not present

## 2019-12-01 DIAGNOSIS — M9902 Segmental and somatic dysfunction of thoracic region: Secondary | ICD-10-CM | POA: Diagnosis not present

## 2019-12-01 DIAGNOSIS — F411 Generalized anxiety disorder: Secondary | ICD-10-CM | POA: Diagnosis not present

## 2019-12-08 DIAGNOSIS — F411 Generalized anxiety disorder: Secondary | ICD-10-CM | POA: Diagnosis not present

## 2019-12-08 DIAGNOSIS — M9902 Segmental and somatic dysfunction of thoracic region: Secondary | ICD-10-CM | POA: Diagnosis not present

## 2019-12-08 DIAGNOSIS — M9903 Segmental and somatic dysfunction of lumbar region: Secondary | ICD-10-CM | POA: Diagnosis not present

## 2019-12-08 DIAGNOSIS — M62830 Muscle spasm of back: Secondary | ICD-10-CM | POA: Diagnosis not present

## 2019-12-08 DIAGNOSIS — M9905 Segmental and somatic dysfunction of pelvic region: Secondary | ICD-10-CM | POA: Diagnosis not present

## 2019-12-16 DIAGNOSIS — F411 Generalized anxiety disorder: Secondary | ICD-10-CM | POA: Diagnosis not present

## 2019-12-21 DIAGNOSIS — Z713 Dietary counseling and surveillance: Secondary | ICD-10-CM | POA: Diagnosis not present

## 2019-12-22 DIAGNOSIS — F411 Generalized anxiety disorder: Secondary | ICD-10-CM | POA: Diagnosis not present

## 2019-12-29 DIAGNOSIS — F411 Generalized anxiety disorder: Secondary | ICD-10-CM | POA: Diagnosis not present

## 2020-01-05 DIAGNOSIS — F411 Generalized anxiety disorder: Secondary | ICD-10-CM | POA: Diagnosis not present

## 2020-01-12 DIAGNOSIS — F411 Generalized anxiety disorder: Secondary | ICD-10-CM | POA: Diagnosis not present

## 2020-01-22 DIAGNOSIS — F411 Generalized anxiety disorder: Secondary | ICD-10-CM | POA: Diagnosis not present

## 2020-01-26 DIAGNOSIS — F411 Generalized anxiety disorder: Secondary | ICD-10-CM | POA: Diagnosis not present

## 2020-02-02 DIAGNOSIS — F411 Generalized anxiety disorder: Secondary | ICD-10-CM | POA: Diagnosis not present

## 2020-02-09 DIAGNOSIS — F411 Generalized anxiety disorder: Secondary | ICD-10-CM | POA: Diagnosis not present

## 2020-02-15 DIAGNOSIS — F411 Generalized anxiety disorder: Secondary | ICD-10-CM | POA: Diagnosis not present

## 2020-02-23 DIAGNOSIS — F411 Generalized anxiety disorder: Secondary | ICD-10-CM | POA: Diagnosis not present

## 2020-02-29 DIAGNOSIS — Z3169 Encounter for other general counseling and advice on procreation: Secondary | ICD-10-CM | POA: Diagnosis not present

## 2020-02-29 DIAGNOSIS — Z309 Encounter for contraceptive management, unspecified: Secondary | ICD-10-CM | POA: Diagnosis not present

## 2020-02-29 DIAGNOSIS — Z30432 Encounter for removal of intrauterine contraceptive device: Secondary | ICD-10-CM | POA: Diagnosis not present

## 2020-02-29 DIAGNOSIS — Z319 Encounter for procreative management, unspecified: Secondary | ICD-10-CM | POA: Diagnosis not present

## 2020-03-01 DIAGNOSIS — F411 Generalized anxiety disorder: Secondary | ICD-10-CM | POA: Diagnosis not present

## 2020-03-10 DIAGNOSIS — F411 Generalized anxiety disorder: Secondary | ICD-10-CM | POA: Diagnosis not present

## 2020-03-22 DIAGNOSIS — F411 Generalized anxiety disorder: Secondary | ICD-10-CM | POA: Diagnosis not present

## 2020-03-30 DIAGNOSIS — F411 Generalized anxiety disorder: Secondary | ICD-10-CM | POA: Diagnosis not present

## 2020-04-05 DIAGNOSIS — F411 Generalized anxiety disorder: Secondary | ICD-10-CM | POA: Diagnosis not present

## 2020-04-12 DIAGNOSIS — F411 Generalized anxiety disorder: Secondary | ICD-10-CM | POA: Diagnosis not present

## 2020-04-14 DIAGNOSIS — J029 Acute pharyngitis, unspecified: Secondary | ICD-10-CM | POA: Diagnosis not present

## 2020-04-14 DIAGNOSIS — Z23 Encounter for immunization: Secondary | ICD-10-CM | POA: Diagnosis not present

## 2020-04-19 DIAGNOSIS — F411 Generalized anxiety disorder: Secondary | ICD-10-CM | POA: Diagnosis not present

## 2020-04-20 DIAGNOSIS — M9905 Segmental and somatic dysfunction of pelvic region: Secondary | ICD-10-CM | POA: Diagnosis not present

## 2020-04-20 DIAGNOSIS — M9902 Segmental and somatic dysfunction of thoracic region: Secondary | ICD-10-CM | POA: Diagnosis not present

## 2020-04-20 DIAGNOSIS — M9903 Segmental and somatic dysfunction of lumbar region: Secondary | ICD-10-CM | POA: Diagnosis not present

## 2020-04-20 DIAGNOSIS — M62830 Muscle spasm of back: Secondary | ICD-10-CM | POA: Diagnosis not present

## 2020-04-27 DIAGNOSIS — M9905 Segmental and somatic dysfunction of pelvic region: Secondary | ICD-10-CM | POA: Diagnosis not present

## 2020-04-27 DIAGNOSIS — M62830 Muscle spasm of back: Secondary | ICD-10-CM | POA: Diagnosis not present

## 2020-04-27 DIAGNOSIS — M9902 Segmental and somatic dysfunction of thoracic region: Secondary | ICD-10-CM | POA: Diagnosis not present

## 2020-04-27 DIAGNOSIS — M9903 Segmental and somatic dysfunction of lumbar region: Secondary | ICD-10-CM | POA: Diagnosis not present

## 2020-04-28 DIAGNOSIS — F411 Generalized anxiety disorder: Secondary | ICD-10-CM | POA: Diagnosis not present

## 2020-05-05 DIAGNOSIS — F411 Generalized anxiety disorder: Secondary | ICD-10-CM | POA: Diagnosis not present

## 2020-05-24 DIAGNOSIS — F411 Generalized anxiety disorder: Secondary | ICD-10-CM | POA: Diagnosis not present

## 2020-06-03 DIAGNOSIS — F411 Generalized anxiety disorder: Secondary | ICD-10-CM | POA: Diagnosis not present

## 2020-06-07 DIAGNOSIS — F411 Generalized anxiety disorder: Secondary | ICD-10-CM | POA: Diagnosis not present

## 2020-06-21 DIAGNOSIS — F411 Generalized anxiety disorder: Secondary | ICD-10-CM | POA: Diagnosis not present

## 2020-06-28 DIAGNOSIS — F411 Generalized anxiety disorder: Secondary | ICD-10-CM | POA: Diagnosis not present

## 2020-07-05 DIAGNOSIS — F411 Generalized anxiety disorder: Secondary | ICD-10-CM | POA: Diagnosis not present

## 2020-07-19 DIAGNOSIS — F411 Generalized anxiety disorder: Secondary | ICD-10-CM | POA: Diagnosis not present

## 2020-07-26 DIAGNOSIS — F411 Generalized anxiety disorder: Secondary | ICD-10-CM | POA: Diagnosis not present

## 2020-07-30 ENCOUNTER — Other Ambulatory Visit: Payer: Self-pay | Admitting: Family Medicine

## 2020-08-02 DIAGNOSIS — M9903 Segmental and somatic dysfunction of lumbar region: Secondary | ICD-10-CM | POA: Diagnosis not present

## 2020-08-02 DIAGNOSIS — M9902 Segmental and somatic dysfunction of thoracic region: Secondary | ICD-10-CM | POA: Diagnosis not present

## 2020-08-02 DIAGNOSIS — F411 Generalized anxiety disorder: Secondary | ICD-10-CM | POA: Diagnosis not present

## 2020-08-02 DIAGNOSIS — M9905 Segmental and somatic dysfunction of pelvic region: Secondary | ICD-10-CM | POA: Diagnosis not present

## 2020-08-02 DIAGNOSIS — M62830 Muscle spasm of back: Secondary | ICD-10-CM | POA: Diagnosis not present

## 2020-08-09 DIAGNOSIS — Z20822 Contact with and (suspected) exposure to covid-19: Secondary | ICD-10-CM | POA: Diagnosis not present

## 2020-08-09 DIAGNOSIS — J029 Acute pharyngitis, unspecified: Secondary | ICD-10-CM | POA: Diagnosis not present

## 2020-08-17 DIAGNOSIS — F411 Generalized anxiety disorder: Secondary | ICD-10-CM | POA: Diagnosis not present

## 2020-08-24 DIAGNOSIS — F411 Generalized anxiety disorder: Secondary | ICD-10-CM | POA: Diagnosis not present

## 2020-08-31 DIAGNOSIS — F411 Generalized anxiety disorder: Secondary | ICD-10-CM | POA: Diagnosis not present

## 2020-09-05 DIAGNOSIS — F411 Generalized anxiety disorder: Secondary | ICD-10-CM | POA: Diagnosis not present

## 2020-09-07 DIAGNOSIS — N911 Secondary amenorrhea: Secondary | ICD-10-CM | POA: Insufficient documentation

## 2020-09-07 DIAGNOSIS — N943 Premenstrual tension syndrome: Secondary | ICD-10-CM | POA: Insufficient documentation

## 2020-09-07 DIAGNOSIS — E669 Obesity, unspecified: Secondary | ICD-10-CM | POA: Insufficient documentation

## 2020-09-13 DIAGNOSIS — Z0000 Encounter for general adult medical examination without abnormal findings: Secondary | ICD-10-CM | POA: Diagnosis not present

## 2020-09-13 DIAGNOSIS — Z1329 Encounter for screening for other suspected endocrine disorder: Secondary | ICD-10-CM | POA: Diagnosis not present

## 2020-09-13 DIAGNOSIS — Z131 Encounter for screening for diabetes mellitus: Secondary | ICD-10-CM | POA: Diagnosis not present

## 2020-09-13 DIAGNOSIS — Z13220 Encounter for screening for lipoid disorders: Secondary | ICD-10-CM | POA: Diagnosis not present

## 2020-09-13 DIAGNOSIS — Z130 Encounter for screening for diseases of the blood and blood-forming organs and certain disorders involving the immune mechanism: Secondary | ICD-10-CM | POA: Diagnosis not present

## 2020-09-13 DIAGNOSIS — Z01419 Encounter for gynecological examination (general) (routine) without abnormal findings: Secondary | ICD-10-CM | POA: Diagnosis not present

## 2020-09-13 DIAGNOSIS — Z6833 Body mass index (BMI) 33.0-33.9, adult: Secondary | ICD-10-CM | POA: Diagnosis not present

## 2020-09-13 DIAGNOSIS — F411 Generalized anxiety disorder: Secondary | ICD-10-CM | POA: Diagnosis not present

## 2020-09-13 LAB — HM MAMMOGRAPHY

## 2020-09-16 ENCOUNTER — Encounter: Payer: Self-pay | Admitting: Family Medicine

## 2020-09-20 DIAGNOSIS — F411 Generalized anxiety disorder: Secondary | ICD-10-CM | POA: Diagnosis not present

## 2020-09-27 DIAGNOSIS — F411 Generalized anxiety disorder: Secondary | ICD-10-CM | POA: Diagnosis not present

## 2020-10-10 DIAGNOSIS — F411 Generalized anxiety disorder: Secondary | ICD-10-CM | POA: Diagnosis not present

## 2020-10-12 DIAGNOSIS — H5213 Myopia, bilateral: Secondary | ICD-10-CM | POA: Diagnosis not present

## 2020-10-12 DIAGNOSIS — H10413 Chronic giant papillary conjunctivitis, bilateral: Secondary | ICD-10-CM | POA: Diagnosis not present

## 2020-10-25 DIAGNOSIS — F411 Generalized anxiety disorder: Secondary | ICD-10-CM | POA: Diagnosis not present

## 2020-11-01 DIAGNOSIS — F411 Generalized anxiety disorder: Secondary | ICD-10-CM | POA: Diagnosis not present

## 2020-11-11 DIAGNOSIS — M9902 Segmental and somatic dysfunction of thoracic region: Secondary | ICD-10-CM | POA: Diagnosis not present

## 2020-11-11 DIAGNOSIS — F411 Generalized anxiety disorder: Secondary | ICD-10-CM | POA: Diagnosis not present

## 2020-11-11 DIAGNOSIS — M9905 Segmental and somatic dysfunction of pelvic region: Secondary | ICD-10-CM | POA: Diagnosis not present

## 2020-11-11 DIAGNOSIS — M62830 Muscle spasm of back: Secondary | ICD-10-CM | POA: Diagnosis not present

## 2020-11-11 DIAGNOSIS — M9903 Segmental and somatic dysfunction of lumbar region: Secondary | ICD-10-CM | POA: Diagnosis not present

## 2020-11-16 DIAGNOSIS — F411 Generalized anxiety disorder: Secondary | ICD-10-CM | POA: Diagnosis not present

## 2020-11-18 DIAGNOSIS — F411 Generalized anxiety disorder: Secondary | ICD-10-CM | POA: Diagnosis not present

## 2020-11-21 DIAGNOSIS — M9905 Segmental and somatic dysfunction of pelvic region: Secondary | ICD-10-CM | POA: Diagnosis not present

## 2020-11-21 DIAGNOSIS — M9903 Segmental and somatic dysfunction of lumbar region: Secondary | ICD-10-CM | POA: Diagnosis not present

## 2020-11-21 DIAGNOSIS — M62830 Muscle spasm of back: Secondary | ICD-10-CM | POA: Diagnosis not present

## 2020-11-21 DIAGNOSIS — M9902 Segmental and somatic dysfunction of thoracic region: Secondary | ICD-10-CM | POA: Diagnosis not present

## 2020-11-22 DIAGNOSIS — F411 Generalized anxiety disorder: Secondary | ICD-10-CM | POA: Diagnosis not present

## 2020-11-23 DIAGNOSIS — F411 Generalized anxiety disorder: Secondary | ICD-10-CM | POA: Diagnosis not present

## 2020-11-29 DIAGNOSIS — F411 Generalized anxiety disorder: Secondary | ICD-10-CM | POA: Diagnosis not present

## 2020-11-30 DIAGNOSIS — F411 Generalized anxiety disorder: Secondary | ICD-10-CM | POA: Diagnosis not present

## 2020-12-06 DIAGNOSIS — F411 Generalized anxiety disorder: Secondary | ICD-10-CM | POA: Diagnosis not present

## 2020-12-07 DIAGNOSIS — F411 Generalized anxiety disorder: Secondary | ICD-10-CM | POA: Diagnosis not present

## 2020-12-15 DIAGNOSIS — F411 Generalized anxiety disorder: Secondary | ICD-10-CM | POA: Diagnosis not present

## 2020-12-21 DIAGNOSIS — F411 Generalized anxiety disorder: Secondary | ICD-10-CM | POA: Diagnosis not present

## 2020-12-22 DIAGNOSIS — F411 Generalized anxiety disorder: Secondary | ICD-10-CM | POA: Diagnosis not present

## 2020-12-23 DIAGNOSIS — N943 Premenstrual tension syndrome: Secondary | ICD-10-CM | POA: Diagnosis not present

## 2020-12-23 DIAGNOSIS — F3281 Premenstrual dysphoric disorder: Secondary | ICD-10-CM | POA: Diagnosis not present

## 2020-12-27 DIAGNOSIS — F411 Generalized anxiety disorder: Secondary | ICD-10-CM | POA: Diagnosis not present

## 2020-12-28 DIAGNOSIS — F411 Generalized anxiety disorder: Secondary | ICD-10-CM | POA: Diagnosis not present

## 2021-01-04 DIAGNOSIS — F411 Generalized anxiety disorder: Secondary | ICD-10-CM | POA: Diagnosis not present

## 2021-01-10 DIAGNOSIS — F411 Generalized anxiety disorder: Secondary | ICD-10-CM | POA: Diagnosis not present

## 2021-01-10 DIAGNOSIS — J4540 Moderate persistent asthma, uncomplicated: Secondary | ICD-10-CM | POA: Diagnosis not present

## 2021-01-10 DIAGNOSIS — J3489 Other specified disorders of nose and nasal sinuses: Secondary | ICD-10-CM | POA: Diagnosis not present

## 2021-01-11 DIAGNOSIS — F411 Generalized anxiety disorder: Secondary | ICD-10-CM | POA: Diagnosis not present

## 2021-01-18 DIAGNOSIS — M9902 Segmental and somatic dysfunction of thoracic region: Secondary | ICD-10-CM | POA: Diagnosis not present

## 2021-01-18 DIAGNOSIS — M791 Myalgia, unspecified site: Secondary | ICD-10-CM | POA: Diagnosis not present

## 2021-01-18 DIAGNOSIS — M9905 Segmental and somatic dysfunction of pelvic region: Secondary | ICD-10-CM | POA: Diagnosis not present

## 2021-01-18 DIAGNOSIS — M62830 Muscle spasm of back: Secondary | ICD-10-CM | POA: Diagnosis not present

## 2021-01-18 DIAGNOSIS — M9903 Segmental and somatic dysfunction of lumbar region: Secondary | ICD-10-CM | POA: Diagnosis not present

## 2021-01-21 DIAGNOSIS — F411 Generalized anxiety disorder: Secondary | ICD-10-CM | POA: Diagnosis not present

## 2021-01-21 DIAGNOSIS — S0033XA Contusion of nose, initial encounter: Secondary | ICD-10-CM | POA: Diagnosis not present

## 2021-01-25 DIAGNOSIS — F411 Generalized anxiety disorder: Secondary | ICD-10-CM | POA: Diagnosis not present

## 2021-01-27 DIAGNOSIS — R1032 Left lower quadrant pain: Secondary | ICD-10-CM | POA: Diagnosis not present

## 2021-02-01 DIAGNOSIS — F411 Generalized anxiety disorder: Secondary | ICD-10-CM | POA: Diagnosis not present

## 2021-02-02 ENCOUNTER — Telehealth (INDEPENDENT_AMBULATORY_CARE_PROVIDER_SITE_OTHER): Payer: BC Managed Care – PPO | Admitting: Family Medicine

## 2021-02-02 DIAGNOSIS — J4520 Mild intermittent asthma, uncomplicated: Secondary | ICD-10-CM | POA: Diagnosis not present

## 2021-02-02 DIAGNOSIS — U071 COVID-19: Secondary | ICD-10-CM

## 2021-02-02 MED ORDER — BENZONATATE 100 MG PO CAPS: 100.0000 mg | ORAL_CAPSULE | Freq: Three times a day (TID) | ORAL | 0 refills | Status: DC | PRN

## 2021-02-02 MED ORDER — ALBUTEROL SULFATE HFA 108 (90 BASE) MCG/ACT IN AERS: 2.0000 | INHALATION_SPRAY | Freq: Four times a day (QID) | RESPIRATORY_TRACT | 2 refills | Status: DC | PRN

## 2021-02-02 MED ORDER — BUDESONIDE-FORMOTEROL FUMARATE 160-4.5 MCG/ACT IN AERO: INHALATION_SPRAY | RESPIRATORY_TRACT | 0 refills | Status: DC

## 2021-02-02 NOTE — Patient Instructions (Addendum)
  HOME CARE TIPS:  -I sent the medication(s) we discussed to your pharmacy: Meds ordered this encounter  Medications   benzonatate (TESSALON PERLES) 100 MG capsule    Sig: Take 1 capsule (100 mg total) by mouth 3 (three) times daily as needed.    Dispense:  20 capsule    Refill:  0   albuterol (VENTOLIN HFA) 108 (90 Base) MCG/ACT inhaler    Sig: Inhale 2 puffs into the lungs every 6 (six) hours as needed for wheezing or shortness of breath.    Dispense:  8 g    Refill:  2   budesonide-formoterol (SYMBICORT) 160-4.5 MCG/ACT inhaler    Sig: TAKE 2 PUFFS BY MOUTH TWICE A DAY    Dispense:  10.2 each    Refill:  0    -can use tylenol or aleve if needed for fevers, aches and pains per instructions  -can use nasal saline a few times per day if you have nasal congestion  -stay hydrated, drink plenty of fluids and eat small healthy meals - avoid dairy  -can take 1000 IU ( ) Vit D3 and 100-500 mg of Vit C daily per instructions  -If the Covid test is positive, check out the CDC website for more information on home care, transmission and treatment for COVID19  -follow up with your doctor in 2-3 days unless improving and feeling better  -stay home while sick, except to seek medical care. If you have COVID19, ideally it would be best to stay home for a full 10 days since the onset of symptoms PLUS one day of no fever and feeling better. Wear a good mask that fits snugly (such as N95 or KN95) if around others to reduce the risk of transmission.  It was nice to meet you today, and I really hope you are feeling better soon. I help [facility] out with telemedicine visits on Tuesdays and Thursdays and am available for visits on those days. If you have any concerns or questions following this visit please schedule a follow up visit with your Primary Care doctor or seek care at a local urgent care clinic to avoid delays in care.    Seek in person care or schedule a follow up video visit promptly  if your symptoms worsen, new concerns arise or you are not improving with treatment. Call 911 and/or seek emergency care if your symptoms are severe or life threatening.

## 2021-02-02 NOTE — Progress Notes (Signed)
Virtual Visit via Video Note  I connected with Chelsea Drake  on 02/02/21 at  3:40 PM EDT by a video enabled telemedicine application and verified that I am speaking with the correct person using two identifiers.  Location patient: home, [place] Location provider:work or home office Persons participating in the virtual visit: patient, provider  I discussed the limitations of evaluation and management by telemedicine and the availability of in person appointments. The patient expressed understanding and agreed to proceed.   HPI:  Acute telemedicine visit for Covid19: -Onset: 2-3 days ago; tested positive for covid yesterday -Symptoms include: cough, nasal congestion, mild asthma symptoms last night and did use her inhaler a few times.  Reports she feels better today and actually feels fine currently. -Denies:cp, sob, NVD, inability eat/drink/get out of bed -Has tried:sudafed, tylenol, albuterol -Pertinent past medical history:see below -Pertinent medication allergies: No Known Allergies -COVID-19 vaccine status: vaccinated and boosted x1 -No recent labs -Denies any chance of pregnancy -She is requesting a refill on her albuterol and Symbicort.  She uses Symbicort as needed when sick.  ROS: See pertinent positives and negatives per HPI.  Past Medical History:  Diagnosis Date   Allergy    Asthma     Past Surgical History:  Procedure Laterality Date   FINGER SURGERY Right    5th digit     Current Outpatient Medications:    albuterol (VENTOLIN HFA) 108 (90 Base) MCG/ACT inhaler, Inhale 2 puffs into the lungs every 6 (six) hours as needed for wheezing or shortness of breath., Disp: 8 g, Rfl: 2   benzonatate (TESSALON PERLES) 100 MG capsule, Take 1 capsule (100 mg total) by mouth 3 (three) times daily as needed., Disp: 20 capsule, Rfl: 0   budesonide-formoterol (SYMBICORT) 160-4.5 MCG/ACT inhaler, TAKE 2 PUFFS BY MOUTH TWICE A DAY, Disp: 10.2 each, Rfl: 0   CALCIUM PO, Take by mouth daily.,  Disp: , Rfl:    cetirizine (ZYRTEC) 5 MG tablet, Take 5 mg by mouth daily., Disp: , Rfl:    cyclobenzaprine (FLEXERIL) 5 MG tablet, Take 1 tablet (5 mg total) by mouth 3 (three) times daily as needed for muscle spasms., Disp: 30 tablet, Rfl: 0   Fluticasone Propionate (FLONASE NA), Place into the nose., Disp: , Rfl:    hydrOXYzine (ATARAX/VISTARIL) 25 MG tablet, TAKE 1/2 -1 TABLETS BY MOUTH DAILY AS NEEDED FOR ANXIETY., Disp: 90 tablet, Rfl: 1   Levonorgestrel (KYLEENA) 19.5 MG IUD, by Intrauterine route., Disp: , Rfl:    Montelukast Sodium (SINGULAIR PO), Take by mouth., Disp: , Rfl:    Multiple Vitamin (MULTIVITAMIN WITH MINERALS) TABS tablet, Take 1 tablet by mouth daily., Disp: , Rfl:   EXAM:  VITALS per patient if applicable:  GENERAL: alert, oriented, appears well and in no acute distress  HEENT: atraumatic, conjunttiva clear, no obvious abnormalities on inspection of external nose and ears  NECK: normal movements of the head and neck  LUNGS: on inspection no signs of respiratory distress, breathing rate appears normal, no obvious gross SOB, gasping or wheezing  CV: no obvious cyanosis  MS: moves all visible extremities without noticeable abnormality  PSYCH/NEURO: pleasant and cooperative, no obvious depression or anxiety, speech and thought processing grossly intact  ASSESSMENT AND PLAN:  Discussed the following assessment and plan:  COVID-19  Mild intermittent asthma without complication   Discussed treatment options, ideal treatment window, potential complications, isolation and precautions for COVID-19.  After lengthy discussion, the patient declined referral for Covid outpatient covid treatment or antiviral  rx at this time. The patient did want a prescription for cough, Tessalon Rx sent and refills on her inhalers.  Other symptomatic care measures summarized in patient instructions. Advised to seek prompt in person care if worsening, new symptoms arise, or if is not  improving with treatment. Discussed options for inperson care if PCP office not available. Did let this patient know that I only do telemedicine on Tuesdays and Thursdays for [place]. Advised to schedule follow up visit with PCP or [facility] if any further questions or concerns to avoid delays in care.   I discussed the assessment and treatment plan with the patient. The patient was provided an opportunity to ask questions and all were answered. The patient agreed with the plan and demonstrated an understanding of the instructions.     Terressa Koyanagi, DO

## 2021-02-08 DIAGNOSIS — F411 Generalized anxiety disorder: Secondary | ICD-10-CM | POA: Diagnosis not present

## 2021-02-14 DIAGNOSIS — F411 Generalized anxiety disorder: Secondary | ICD-10-CM | POA: Diagnosis not present

## 2021-02-22 DIAGNOSIS — F411 Generalized anxiety disorder: Secondary | ICD-10-CM | POA: Diagnosis not present

## 2021-02-28 DIAGNOSIS — M62830 Muscle spasm of back: Secondary | ICD-10-CM | POA: Diagnosis not present

## 2021-02-28 DIAGNOSIS — M9903 Segmental and somatic dysfunction of lumbar region: Secondary | ICD-10-CM | POA: Diagnosis not present

## 2021-02-28 DIAGNOSIS — M9902 Segmental and somatic dysfunction of thoracic region: Secondary | ICD-10-CM | POA: Diagnosis not present

## 2021-02-28 DIAGNOSIS — M791 Myalgia, unspecified site: Secondary | ICD-10-CM | POA: Diagnosis not present

## 2021-02-28 DIAGNOSIS — M9905 Segmental and somatic dysfunction of pelvic region: Secondary | ICD-10-CM | POA: Diagnosis not present

## 2021-03-01 DIAGNOSIS — F411 Generalized anxiety disorder: Secondary | ICD-10-CM | POA: Diagnosis not present

## 2021-03-10 DIAGNOSIS — F411 Generalized anxiety disorder: Secondary | ICD-10-CM | POA: Diagnosis not present

## 2021-03-15 DIAGNOSIS — M9903 Segmental and somatic dysfunction of lumbar region: Secondary | ICD-10-CM | POA: Diagnosis not present

## 2021-03-15 DIAGNOSIS — M9902 Segmental and somatic dysfunction of thoracic region: Secondary | ICD-10-CM | POA: Diagnosis not present

## 2021-03-15 DIAGNOSIS — M5441 Lumbago with sciatica, right side: Secondary | ICD-10-CM | POA: Diagnosis not present

## 2021-03-15 DIAGNOSIS — M9905 Segmental and somatic dysfunction of pelvic region: Secondary | ICD-10-CM | POA: Diagnosis not present

## 2021-03-21 ENCOUNTER — Telehealth: Payer: Self-pay | Admitting: Family Medicine

## 2021-03-21 DIAGNOSIS — F411 Generalized anxiety disorder: Secondary | ICD-10-CM | POA: Diagnosis not present

## 2021-03-21 NOTE — Telephone Encounter (Signed)
Patient states she needs a refill on hydrOXYzine (ATARAX/VISTARIL) 25 MG tablet. Patient states there is no refills ordered on it and she doesn't think the pharmacy will fill it since it has been so long.     Good callback number is [phone]    Please Advise

## 2021-03-21 NOTE — Telephone Encounter (Signed)
Spoke with the patient and informed her a visit is needed as she has not had a visit in over a year.  Virtual visit scheduled for 9/9.

## 2021-03-21 NOTE — Telephone Encounter (Signed)
Patient has not been seen in over 1 year.  Left a message for the patient to return my call.

## 2021-03-24 ENCOUNTER — Telehealth (INDEPENDENT_AMBULATORY_CARE_PROVIDER_SITE_OTHER): Payer: BC Managed Care – PPO | Admitting: Family Medicine

## 2021-03-24 ENCOUNTER — Encounter: Payer: Self-pay | Admitting: Family Medicine

## 2021-03-24 VITALS — Ht 64.5 in | Wt 196.0 lb

## 2021-03-24 DIAGNOSIS — J302 Other seasonal allergic rhinitis: Secondary | ICD-10-CM | POA: Diagnosis not present

## 2021-03-24 DIAGNOSIS — J4540 Moderate persistent asthma, uncomplicated: Secondary | ICD-10-CM | POA: Diagnosis not present

## 2021-03-24 DIAGNOSIS — F419 Anxiety disorder, unspecified: Secondary | ICD-10-CM

## 2021-03-24 DIAGNOSIS — E663 Overweight: Secondary | ICD-10-CM

## 2021-03-24 MED ORDER — HYDROXYZINE HCL 25 MG PO TABS
ORAL_TABLET | ORAL | 1 refills | Status: DC
Start: 2021-03-24 — End: 2023-10-14

## 2021-03-24 NOTE — Progress Notes (Signed)
Virtual Visit via Video Note  I connected with Chelsea Drake on 03/24/21 at 11:30 AM EDT by a video enabled telemedicine application and verified that I am speaking with the correct person using two identifiers.  Location patient: home Location provider: [facility]  [address] [place], [place] [address] Persons participating in the virtual visit: patient, provider  I discussed the limitations of evaluation and management by telemedicine and the availability of in person appointments. The patient expressed understanding and agreed to proceed.   Chelsea Drake DOB: 01/31/1976 Encounter date: 03/24/2021  This is a 45 y.o. female who presents with Chief Complaint  Patient presents with   Medication Refill    Patient request refills on Hydroxyzine 25 mg    Anxiety    History of present illness: She had covid in July. Feels like she has recovered. Was traveling a lot and had a lot of potential exposures during her trips. Started feeling like allergies, felt a little run down second day and really felt fine by third day. She did do double dose of symbicort for 3-4 weeks. Then returned to single dose. Used albuterol BID over weekend when staying in older, musty smelling hotel, but not since.   Wanted to get refill on hydroxyzine. Since she was due for visit she had to make appointment to address this.   Had IUD taken out a year ago. For first few months after this noted more anxiety. Was moodier, shorter tempered than usual. Just felt more "blah". Talked with obgyn and has just started 3rd month of being on birth control. Thought that she was just having normal swings with cycle/maybe premenopausal. On lo loestrin and this has helped pretty quickly with mood.  She is working on weight loss.   Allergies have generally been good. Well controlled with her current medications - zyrtec, flonase, eye drops, sudafed if needed.   Just started back on isogenics at beginning  of week. Tried nutritionist in 2021 but only lost 1 lb in that process. Was successful with isogenics in past and had lost 25lbs.   She has had bloodwork done through work health screening - has hx of elevated platelets and has seen specialist in past for this. No further eval recommended.   No Known Allergies Current Meds  Medication Sig   albuterol (VENTOLIN HFA) 108 (90 Base) MCG/ACT inhaler Inhale 2 puffs into the lungs every 6 (six) hours as needed for wheezing or shortness of breath.   benzonatate (TESSALON PERLES) 100 MG capsule Take 1 capsule (100 mg total) by mouth 3 (three) times daily as needed.   budesonide-formoterol (SYMBICORT) 160-4.5 MCG/ACT inhaler TAKE 2 PUFFS BY MOUTH TWICE A DAY   CALCIUM PO Take by mouth daily.   cetirizine (ZYRTEC) 5 MG tablet Take 5 mg by mouth daily.   cyclobenzaprine (FLEXERIL) 5 MG tablet Take 1 tablet (5 mg total) by mouth 3 (three) times daily as needed for muscle spasms.   Fluticasone Propionate (FLONASE NA) Place into the nose.   hydrOXYzine (ATARAX/VISTARIL) 25 MG tablet TAKE 1/2 -1 TABLETS BY MOUTH DAILY AS NEEDED FOR ANXIETY.   Montelukast Sodium (SINGULAIR PO) Take by mouth.   Multiple Vitamin (MULTIVITAMIN WITH MINERALS) TABS tablet Take 1 tablet by mouth daily.   Norethindrone-Ethinyl Estradiol-Fe Biphas (LO LOESTRIN FE) 1 MG-10 MCG / 10 MCG tablet Take by mouth.   [DISCONTINUED] Levonorgestrel (KYLEENA) 19.5 MG IUD by Intrauterine route.    Review of Systems  Constitutional:  Negative  for chills, fatigue and fever.  Respiratory:  Negative for cough, chest tightness, shortness of breath and wheezing.   Cardiovascular:  Negative for chest pain, palpitations and leg swelling.   Objective:  Ht 5' 4.5" (1.638 m)   Wt 196 lb (88.9 kg)   BMI 33.12 kg/m   Weight: 196 lb (88.9 kg)   BP Readings from Last 3 Encounters:  11/11/19 112/90  01/21/19 102/80  06/30/13 122/78   Wt Readings from Last 3 Encounters:  03/24/21 196 lb (88.9 kg)   11/11/19 196 lb 1.6 oz (89 kg)  08/07/19 188 lb (85.3 kg)    EXAM:  GENERAL: alert, oriented, appears well and in no acute distress  HEENT: atraumatic, conjunctiva clear, no obvious abnormalities on inspection of external nose and ears  NECK: normal movements of the head and neck  LUNGS: on inspection no signs of respiratory distress, breathing rate appears normal, no obvious gross SOB, gasping or wheezing  CV: no obvious cyanosis  MS: moves all visible extremities without noticeable abnormality  PSYCH/NEURO: pleasant and cooperative, no obvious depression or anxiety, speech and thought processing grossly intact   Assessment/Plan  1. Anxiety Has been well controlled. Hydroxyzine as needed. Improved mood on regular ocp. - hydrOXYzine (ATARAX/VISTARIL) 25 MG tablet; TAKE 1/2 -1 TABLETS BY MOUTH DAILY AS NEEDED FOR ANXIETY.  Dispense: 90 tablet; Refill: 1  2. Moderate persistent asthma without complication Well controlled on symbicort with good allergy   3. Seasonal allergies Have been well controlled with current flonase, antihistamine, singulair treatment.   4. Overweight She is working on regular biking at home [employer])  Return in about 6 months (around 09/21/2021) for physical exam.    I discussed the assessment and treatment plan with the patient. The patient was provided an opportunity to ask questions and all were answered. The patient agreed with the plan and demonstrated an understanding of the instructions.   The patient was advised to call back or seek an in-person evaluation if the symptoms worsen or if the condition fails to improve as anticipated.  I provided 20 minutes of non-face-to-face time during this encounter.   Theodis Shove, MD

## 2021-03-31 DIAGNOSIS — R1032 Left lower quadrant pain: Secondary | ICD-10-CM | POA: Diagnosis not present

## 2021-03-31 DIAGNOSIS — N83202 Unspecified ovarian cyst, left side: Secondary | ICD-10-CM | POA: Diagnosis not present

## 2021-04-04 DIAGNOSIS — F411 Generalized anxiety disorder: Secondary | ICD-10-CM | POA: Diagnosis not present

## 2021-04-07 DIAGNOSIS — F411 Generalized anxiety disorder: Secondary | ICD-10-CM | POA: Diagnosis not present

## 2021-04-18 DIAGNOSIS — F411 Generalized anxiety disorder: Secondary | ICD-10-CM | POA: Diagnosis not present

## 2021-04-25 DIAGNOSIS — F411 Generalized anxiety disorder: Secondary | ICD-10-CM | POA: Diagnosis not present

## 2021-05-16 DIAGNOSIS — F411 Generalized anxiety disorder: Secondary | ICD-10-CM | POA: Diagnosis not present

## 2021-05-22 DIAGNOSIS — F411 Generalized anxiety disorder: Secondary | ICD-10-CM | POA: Diagnosis not present

## 2021-05-25 DIAGNOSIS — F411 Generalized anxiety disorder: Secondary | ICD-10-CM | POA: Diagnosis not present

## 2021-05-29 DIAGNOSIS — M9902 Segmental and somatic dysfunction of thoracic region: Secondary | ICD-10-CM | POA: Diagnosis not present

## 2021-05-29 DIAGNOSIS — M9905 Segmental and somatic dysfunction of pelvic region: Secondary | ICD-10-CM | POA: Diagnosis not present

## 2021-05-29 DIAGNOSIS — M5441 Lumbago with sciatica, right side: Secondary | ICD-10-CM | POA: Diagnosis not present

## 2021-05-29 DIAGNOSIS — M9903 Segmental and somatic dysfunction of lumbar region: Secondary | ICD-10-CM | POA: Diagnosis not present

## 2021-05-30 DIAGNOSIS — F411 Generalized anxiety disorder: Secondary | ICD-10-CM | POA: Diagnosis not present

## 2021-06-07 DIAGNOSIS — F411 Generalized anxiety disorder: Secondary | ICD-10-CM | POA: Diagnosis not present

## 2021-06-13 DIAGNOSIS — F411 Generalized anxiety disorder: Secondary | ICD-10-CM | POA: Diagnosis not present

## 2021-06-20 DIAGNOSIS — M7071 Other bursitis of hip, right hip: Secondary | ICD-10-CM | POA: Diagnosis not present

## 2021-06-20 DIAGNOSIS — F411 Generalized anxiety disorder: Secondary | ICD-10-CM | POA: Diagnosis not present

## 2021-06-20 DIAGNOSIS — M9903 Segmental and somatic dysfunction of lumbar region: Secondary | ICD-10-CM | POA: Diagnosis not present

## 2021-06-20 DIAGNOSIS — M5441 Lumbago with sciatica, right side: Secondary | ICD-10-CM | POA: Diagnosis not present

## 2021-06-20 DIAGNOSIS — M9902 Segmental and somatic dysfunction of thoracic region: Secondary | ICD-10-CM | POA: Diagnosis not present

## 2021-06-20 DIAGNOSIS — M9905 Segmental and somatic dysfunction of pelvic region: Secondary | ICD-10-CM | POA: Diagnosis not present

## 2021-06-23 DIAGNOSIS — M9902 Segmental and somatic dysfunction of thoracic region: Secondary | ICD-10-CM | POA: Diagnosis not present

## 2021-06-23 DIAGNOSIS — M5441 Lumbago with sciatica, right side: Secondary | ICD-10-CM | POA: Diagnosis not present

## 2021-06-23 DIAGNOSIS — M9903 Segmental and somatic dysfunction of lumbar region: Secondary | ICD-10-CM | POA: Diagnosis not present

## 2021-06-23 DIAGNOSIS — M9905 Segmental and somatic dysfunction of pelvic region: Secondary | ICD-10-CM | POA: Diagnosis not present

## 2021-06-27 DIAGNOSIS — F411 Generalized anxiety disorder: Secondary | ICD-10-CM | POA: Diagnosis not present

## 2021-06-30 ENCOUNTER — Telehealth: Payer: BC Managed Care – PPO | Admitting: Physician Assistant

## 2021-06-30 ENCOUNTER — Encounter: Payer: Self-pay | Admitting: Family Medicine

## 2021-06-30 ENCOUNTER — Telehealth: Payer: Self-pay

## 2021-06-30 DIAGNOSIS — J4541 Moderate persistent asthma with (acute) exacerbation: Secondary | ICD-10-CM

## 2021-06-30 MED ORDER — BENZONATATE 100 MG PO CAPS: 100.0000 mg | ORAL_CAPSULE | Freq: Three times a day (TID) | ORAL | 0 refills | Status: DC | PRN

## 2021-06-30 MED ORDER — FLUTICASONE PROPIONATE HFA 110 MCG/ACT IN AERO: 2.0000 | INHALATION_SPRAY | Freq: Two times a day (BID) | RESPIRATORY_TRACT | 0 refills | Status: DC

## 2021-06-30 MED ORDER — PROMETHAZINE-DM 6.25-15 MG/5ML PO SYRP: 10.0000 mL | ORAL_SOLUTION | Freq: Four times a day (QID) | ORAL | 0 refills | Status: DC | PRN

## 2021-06-30 MED ORDER — ALBUTEROL SULFATE HFA 108 (90 BASE) MCG/ACT IN AERS: 1.0000 | INHALATION_SPRAY | Freq: Four times a day (QID) | RESPIRATORY_TRACT | 0 refills | Status: DC | PRN

## 2021-06-30 NOTE — Patient Instructions (Signed)
Chelsea Drake, thank you for joining [name], PA-C for today's virtual visit.  While this provider is not your primary care provider (PCP), if your PCP is located in our provider database this encounter information will be shared with them immediately following your visit.  Consent: (Patient) Chelsea Drake provided verbal consent for this virtual visit at the beginning of the encounter.  Current Medications:  Current Outpatient Medications:    albuterol (VENTOLIN HFA) 108 (90 Base) MCG/ACT inhaler, Inhale 1-2 puffs into the lungs every 6 (six) hours as needed for wheezing or shortness of breath., Disp: 18 g, Rfl: 0   benzonatate (TESSALON) 100 MG capsule, Take 1 capsule (100 mg total) by mouth 3 (three) times daily as needed., Disp: 30 capsule, Rfl: 0   fluticasone (FLOVENT HFA) 110 MCG/ACT inhaler, Inhale 2 puffs into the lungs in the morning and at bedtime., Disp: 1 each, Rfl: 0   promethazine-dextromethorphan (PROMETHAZINE-DM) 6.25-15 MG/5ML syrup, Take 10 mLs by mouth 4 (four) times daily as needed for cough., Disp: 118 mL, Rfl: 0   budesonide-formoterol (SYMBICORT) 160-4.5 MCG/ACT inhaler, TAKE 2 PUFFS BY MOUTH TWICE A DAY, Disp: 10.2 each, Rfl: 0   CALCIUM PO, Take by mouth daily., Disp: , Rfl:    cetirizine (ZYRTEC) 5 MG tablet, Take 5 mg by mouth daily., Disp: , Rfl:    cyclobenzaprine (FLEXERIL) 5 MG tablet, Take 1 tablet (5 mg total) by mouth 3 (three) times daily as needed for muscle spasms., Disp: 30 tablet, Rfl: 0   Fluticasone Propionate (FLONASE NA), Place into the nose., Disp: , Rfl:    hydrOXYzine (ATARAX/VISTARIL) 25 MG tablet, TAKE 1/2 -1 TABLETS BY MOUTH DAILY AS NEEDED FOR ANXIETY., Disp: 90 tablet, Rfl: 1   Montelukast Sodium (SINGULAIR PO), Take by mouth., Disp: , Rfl:    Multiple Vitamin (MULTIVITAMIN WITH MINERALS) TABS tablet, Take 1 tablet by mouth daily., Disp: , Rfl:    Norethindrone-Ethinyl Estradiol-Fe Biphas (LO LOESTRIN FE) 1 MG-10 MCG / 10  MCG tablet, Take by mouth., Disp: , Rfl:    Medications ordered in this encounter:  Meds ordered this encounter  Medications   promethazine-dextromethorphan (PROMETHAZINE-DM) 6.25-15 MG/5ML syrup    Sig: Take 10 mLs by mouth 4 (four) times daily as needed for cough.    Dispense:  118 mL    Refill:  0    Order Specific Question:   Supervising Provider    Answer:   MILLER, BRIAN [[id number]]   benzonatate (TESSALON) 100 MG capsule    Sig: Take 1 capsule (100 mg total) by mouth 3 (three) times daily as needed.    Dispense:  30 capsule    Refill:  0    Order Specific Question:   Supervising Provider    Answer:   MILLER, BRIAN [[id number]]   fluticasone (FLOVENT HFA) 110 MCG/ACT inhaler    Sig: Inhale 2 puffs into the lungs in the morning and at bedtime.    Dispense:  1 each    Refill:  0    Order Specific Question:   Supervising Provider    Answer:   Hyacinth Meeker, BRIAN [[id number]]   albuterol (VENTOLIN HFA) 108 (90 Base) MCG/ACT inhaler    Sig: Inhale 1-2 puffs into the lungs every 6 (six) hours as needed for wheezing or shortness of breath.    Dispense:  18 g    Refill:  0    Ventolin brand only    Order Specific Question:   Supervising Provider  Answer:   MILLER, BRIAN [[id number]]     *If you need refills on other medications prior to your next appointment, please contact your pharmacy*  Follow-Up: Call back or seek an in-person evaluation if the symptoms worsen or if the condition fails to improve as anticipated.  Other Instructions Asthma, Adult Asthma is a long-term (chronic) condition that causes recurrent episodes in which the airways become tight and narrow. The airways are the passages that lead from the nose and mouth down into the lungs. Asthma episodes, also called asthma attacks, can cause coughing, wheezing, shortness of breath, and chest pain. The airways can also fill with mucus. During an attack, it can be difficult to breathe. Asthma attacks can range from minor to life  threatening. Asthma cannot be cured, but medicines and lifestyle changes can help control it and treat acute attacks. What are the causes? This condition is believed to be caused by inherited (genetic) and environmental factors, but its exact cause is not known. There are many things that can bring on an asthma attack or make asthma symptoms worse (triggers). Asthma triggers are different for each person. Common triggers include: Mold. Dust. Cigarette smoke. Cockroaches. Things that can cause allergy symptoms (allergens), such as animal dander or pollen from trees or grass. Air pollutants such as household cleaners, wood smoke, smog, or Therapist, occupational. Cold air, weather changes, and winds (which increase molds and pollen in the air). Strong emotional expressions such as crying or laughing hard. Stress. Certain medicines (such as aspirin) or types of medicines (such as beta-blockers). Sulfites in foods and drinks. Foods and drinks that may contain sulfites include dried fruit, potato chips, and sparkling grape juice. Infections or inflammatory conditions such as the flu, a cold, or inflammation of the nasal membranes (rhinitis). Gastroesophageal reflux disease (GERD). Exercise or strenuous activity. What are the signs or symptoms? Symptoms of this condition may occur right after asthma is triggered or many hours later. Symptoms include: Wheezing. This can sound like whistling when you breathe. Excessive nighttime or early morning coughing. Frequent or severe coughing with a common cold. Chest tightness. Shortness of breath. Tiredness (fatigue) with minimal activity. How is this diagnosed? This condition is diagnosed based on: Your medical history. A physical exam. Tests, which may include: Lung function studies and pulmonary studies (spirometry). These tests can evaluate the flow of air in your lungs. Allergy tests. Imaging tests, such as X-rays. How is this treated? There is no  cure for this condition, but treatment can help control your symptoms. Treatment for asthma usually involves: Identifying and avoiding your asthma triggers. Using medicines to control your symptoms. Generally, two types of medicines are used to treat asthma: Controller medicines. These help prevent asthma symptoms from occurring. They are usually taken every day. Fast-acting reliever or rescue medicines. These quickly relieve asthma symptoms by widening the narrow and tight airways. They are used as needed and provide short-term relief. Using supplemental oxygen. This may be needed during a severe episode. Using other medicines, such as: Allergy medicines, such as antihistamines, if your asthma attacks are triggered by allergens. Immune medicines (immunomodulators). These are medicines that help control the immune system. Creating an asthma action plan. An asthma action plan is a written plan for managing and treating your asthma attacks. This plan includes: A list of your asthma triggers and how to avoid them. Information about when medicines should be taken and when their dosage should be changed. Instructions about using a device called a peak flow meter.  A peak flow meter measures how well the lungs are working and the severity of your asthma. It helps you monitor your condition. Follow these instructions at home: Controlling your home environment Control your home environment in the following ways to help avoid triggers and prevent asthma attacks: Change your heating and air conditioning filter regularly. Limit your use of fireplaces and wood stoves. Get rid of pests (such as roaches and mice) and their droppings. Throw away plants if you see mold on them. Clean floors and dust surfaces regularly. Use unscented cleaning products. Try to have someone else vacuum for you regularly. Stay out of rooms while they are being vacuumed and for a short while afterward. If you vacuum, use a dust mask  from a hardware store, a double-layered or microfilter vacuum cleaner bag, or a vacuum cleaner with a HEPA filter. Replace carpet with wood, tile, or vinyl flooring. Carpet can trap dander and dust. Use allergy-proof pillows, mattress covers, and box spring covers. Keep your bedroom a trigger-free room. Avoid pets and keep windows closed when allergens are in the air. Wash beddings every week in hot water and dry them in a dryer. Use blankets that are made of polyester or cotton. Clean bathrooms and kitchens with bleach. If possible, have someone repaint the walls in these rooms with mold-resistant paint. Stay out of the rooms that are being cleaned and painted. Wash your hands often with soap and water. If soap and water are not available, use hand sanitizer. Do not allow anyone to smoke in your home. General instructions Take over-the-counter and prescription medicines only as told by your health care provider. Speak with your health care provider if you have questions about how or when to take the medicines. Make note if you are requiring more frequent dosages. Do not use any products that contain nicotine or tobacco, such as cigarettes and e-cigarettes. If you need help quitting, ask your health care provider. Also, avoid being exposed to secondhand smoke. Use a peak flow meter as told by your health care provider. Record and keep track of the readings. Understand and use the asthma action plan to help minimize, or stop an asthma attack, without needing to seek medical care. Make sure you stay up to date on your yearly vaccinations as told by your health care provider. This may include vaccines for the flu and pneumonia. Avoid outdoor activities when allergen counts are high and when air quality is low. Wear a ski mask that covers your nose and mouth during outdoor winter activities. Exercise indoors on cold days if you can. Warm up before exercising, and take time for a cool-down period  after exercise. Keep all follow-up visits as told by your health care provider. This is important. Where to find more information For information about asthma, turn to the Centers for Disease Control and Prevention at http://www.mills-berg.com/ For air quality information, turn to [employer] at http://hood.com/ Contact a health care provider if: You have wheezing, shortness of breath, or a cough even while you are taking medicine to prevent attacks. The mucus you cough up (sputum) is thicker than usual. Your sputum changes from clear or white to yellow, green, gray, or bloody. Your medicines are causing side effects, such as a rash, itching, swelling, or trouble breathing. You need to use a reliever medicine more than 2-3 times a week. Your peak flow reading is still at 50-79% of your personal best after following your action plan for 1 hour. You have a fever. Get  help right away if: You are getting worse and do not respond to treatment during an asthma attack. You are short of breath when at rest or when doing very little physical activity. You have difficulty eating, drinking, or talking. You have chest pain or tightness. You develop a fast heartbeat or palpitations. You have a bluish color to your lips or fingernails. You are light-headed or dizzy, or you faint. Your peak flow reading is less than 50% of your personal best. You feel too tired to breathe normally. Summary Asthma is a long-term (chronic) condition that causes recurrent episodes in which the airways become tight and narrow. These episodes can cause coughing, wheezing, shortness of breath, and chest pain. Asthma cannot be cured, but medicines and lifestyle changes can help control it and treat acute attacks. Make sure you understand how to avoid triggers and how and when to use your medicines. Asthma attacks can range from minor to life threatening. Get help right away if you have an asthma attack and do not respond to treatment with  your usual rescue medicines. This information is not intended to replace advice given to you by your health care provider. Make sure you discuss any questions you have with your health care provider. Document Revised: 04/01/2020 Document Reviewed: 11/04/2019 Elsevier Patient Education  2022 [employer].    If you have been instructed to have an in-person evaluation today at a local Urgent Care facility, please use the link below. It will take you to a list of all of our available [facility], including address, phone number and hours of operation. Please do not delay care.  [facility]  If you or a family member do not have a primary care provider, use the link below to schedule a visit and establish care. When you choose a [facility] primary care physician or advanced practice provider, you gain a long-term partner in health. Find a Primary Care Provider  Learn more about [facility]'s in-office and virtual care options: [facility] - Get Care Now

## 2021-06-30 NOTE — Progress Notes (Signed)
Virtual Visit Consent   Chelsea Drake, you are scheduled for a virtual visit with a [facility] provider today.     Just as with appointments in the office, your consent must be obtained to participate.  Your consent will be active for this visit and any virtual visit you may have with one of our providers in the next 365 days.     If you have a MyChart account, a copy of this consent can be sent to you electronically.  All virtual visits are billed to your insurance company just like a traditional visit in the office.    As this is a virtual visit, video technology does not allow for your provider to perform a traditional examination.  This may limit your provider's ability to fully assess your condition.  If your provider identifies any concerns that need to be evaluated in person or the need to arrange testing (such as labs, EKG, etc.), we will make arrangements to do so.     Although advances in technology are sophisticated, we cannot ensure that it will always work on either your end or our end.  If the connection with a video visit is poor, the visit may have to be switched to a telephone visit.  With either a video or telephone visit, we are not always able to ensure that we have a secure connection.     I need to obtain your verbal consent now.   Are you willing to proceed with your visit today?    [name] has provided verbal consent on 06/30/2021 for a virtual visit (video or telephone).   [name], PA-C   Date: 06/30/2021 6:00 PM   Virtual Visit via Video Note   I, [name], connected with  Chelsea Drake  ([id number], 02/08/1976) on 06/30/21 at  5:00 PM EST by a video-enabled telemedicine application and verified that I am speaking with the correct person using two identifiers.  Location: Patient: Virtual Visit Location Patient: Home Provider: Virtual Visit Location Provider: Home Office   I discussed the limitations of evaluation and  management by telemedicine and the availability of in person appointments. The patient expressed understanding and agreed to proceed.    History of Present Illness: Chelsea Drake is a 45 y.o. who identifies as a female who was assigned female at birth, and is being seen today for asthma exacerbation.   HPI: Cough This is a new problem. The current episode started in the past 7 days (mild symptoms Tuesday, worsening on Weds). The problem has been unchanged. The problem occurs every few minutes. The cough is Non-productive. Associated symptoms include chest pain (tightness), nasal congestion (mild), postnasal drip, rhinorrhea, shortness of breath (with coughing spells) and wheezing. Pertinent negatives include no chills or fever. The symptoms are aggravated by lying down. She has tried prescription cough suppressant, OTC cough suppressant, a beta-agonist inhaler and ipratropium inhaler for the symptoms. The treatment provided mild relief. Her past medical history is significant for asthma and environmental allergies.     Problems:  Patient Active Problem List   Diagnosis Date Noted   Asthma 01/21/2019   Anxiety 01/21/2019   Seasonal allergies 01/21/2019    Allergies: No Known Allergies Medications:  Current Outpatient Medications:    albuterol (VENTOLIN HFA) 108 (90 Base) MCG/ACT inhaler, Inhale 1-2 puffs into the lungs every 6 (six) hours as needed for wheezing or shortness of breath., Disp: 18 g, Rfl: 0   benzonatate (TESSALON) 100  MG capsule, Take 1 capsule (100 mg total) by mouth 3 (three) times daily as needed., Disp: 30 capsule, Rfl: 0   fluticasone (FLOVENT HFA) 110 MCG/ACT inhaler, Inhale 2 puffs into the lungs in the morning and at bedtime., Disp: 1 each, Rfl: 0   promethazine-dextromethorphan (PROMETHAZINE-DM) 6.25-15 MG/5ML syrup, Take 10 mLs by mouth 4 (four) times daily as needed for cough., Disp: 118 mL, Rfl: 0   budesonide-formoterol (SYMBICORT) 160-4.5 MCG/ACT inhaler, TAKE 2  PUFFS BY MOUTH TWICE A DAY, Disp: 10.2 each, Rfl: 0   CALCIUM PO, Take by mouth daily., Disp: , Rfl:    cetirizine (ZYRTEC) 5 MG tablet, Take 5 mg by mouth daily., Disp: , Rfl:    cyclobenzaprine (FLEXERIL) 5 MG tablet, Take 1 tablet (5 mg total) by mouth 3 (three) times daily as needed for muscle spasms., Disp: 30 tablet, Rfl: 0   Fluticasone Propionate (FLONASE NA), Place into the nose., Disp: , Rfl:    hydrOXYzine (ATARAX/VISTARIL) 25 MG tablet, TAKE 1/2 -1 TABLETS BY MOUTH DAILY AS NEEDED FOR ANXIETY., Disp: 90 tablet, Rfl: 1   Montelukast Sodium (SINGULAIR PO), Take by mouth., Disp: , Rfl:    Multiple Vitamin (MULTIVITAMIN WITH MINERALS) TABS tablet, Take 1 tablet by mouth daily., Disp: , Rfl:    Norethindrone-Ethinyl Estradiol-Fe Biphas (LO LOESTRIN FE) 1 MG-10 MCG / 10 MCG tablet, Take by mouth., Disp: , Rfl:   Observations/Objective: Patient is well-developed, well-nourished in no acute distress.  Resting comfortably at home.  Head is normocephalic, atraumatic.  No labored breathing.  Speech is clear and coherent with logical content.  Patient is alert and oriented at baseline.    Assessment and Plan: 1. Moderate persistent asthma with exacerbation - promethazine-dextromethorphan (PROMETHAZINE-DM) 6.25-15 MG/5ML syrup; Take 10 mLs by mouth 4 (four) times daily as needed for cough.  Dispense: 118 mL; Refill: 0 - benzonatate (TESSALON) 100 MG capsule; Take 1 capsule (100 mg total) by mouth 3 (three) times daily as needed.  Dispense: 30 capsule; Refill: 0 - fluticasone (FLOVENT HFA) 110 MCG/ACT inhaler; Inhale 2 puffs into the lungs in the morning and at bedtime.  Dispense: 1 each; Refill: 0 - albuterol (VENTOLIN HFA) 108 (90 Base) MCG/ACT inhaler; Inhale 1-2 puffs into the lungs every 6 (six) hours as needed for wheezing or shortness of breath.  Dispense: 18 g; Refill: 0  - Added steroid inhaler (Flovent) - Promethazine-DM for cough, may take with Tessalon perles at bedtime for  better sleep - Continue Symbicort and Ventolin as needed - Push fluids - Rest - Seek in person evaluation if symptoms worsen or fail to improve  Follow Up Instructions: I discussed the assessment and treatment plan with the patient. The patient was provided an opportunity to ask questions and all were answered. The patient agreed with the plan and demonstrated an understanding of the instructions.  A copy of instructions were sent to the patient via MyChart unless otherwise noted below.   The patient was advised to call back or seek an in-person evaluation if the symptoms worsen or if the condition fails to improve as anticipated.  Time:  I spent 12 minutes with the patient via telehealth technology discussing the above problems/concerns.    [name], PA-C

## 2021-06-30 NOTE — Telephone Encounter (Signed)
Patient called asking for a call back, patient wanted an appt today and no appts available patient also stated symptoms started 12/13 she has a cough and feels run down and Asthma flaring up.

## 2021-06-30 NOTE — Telephone Encounter (Signed)
Pt is calling back checking on the below message °

## 2021-06-30 NOTE — Telephone Encounter (Signed)
Spoke with the patient and advised her to go an urgent care for evaluation as we do not have any openings available.

## 2021-07-01 ENCOUNTER — Other Ambulatory Visit: Payer: Self-pay | Admitting: Family Medicine

## 2021-07-05 DIAGNOSIS — M7071 Other bursitis of hip, right hip: Secondary | ICD-10-CM | POA: Diagnosis not present

## 2021-07-05 DIAGNOSIS — M9905 Segmental and somatic dysfunction of pelvic region: Secondary | ICD-10-CM | POA: Diagnosis not present

## 2021-07-05 DIAGNOSIS — M9903 Segmental and somatic dysfunction of lumbar region: Secondary | ICD-10-CM | POA: Diagnosis not present

## 2021-07-05 DIAGNOSIS — M9902 Segmental and somatic dysfunction of thoracic region: Secondary | ICD-10-CM | POA: Diagnosis not present

## 2021-07-05 DIAGNOSIS — M5441 Lumbago with sciatica, right side: Secondary | ICD-10-CM | POA: Diagnosis not present

## 2021-07-12 ENCOUNTER — Other Ambulatory Visit: Payer: Self-pay

## 2021-07-12 ENCOUNTER — Ambulatory Visit: Payer: BC Managed Care – PPO | Admitting: Family Medicine

## 2021-07-12 ENCOUNTER — Telehealth: Payer: Self-pay | Admitting: Family Medicine

## 2021-07-12 ENCOUNTER — Ambulatory Visit (INDEPENDENT_AMBULATORY_CARE_PROVIDER_SITE_OTHER): Payer: BC Managed Care – PPO

## 2021-07-12 VITALS — BP 112/78 | HR 91 | Temp 98.1°F | Ht 64.5 in | Wt 207.2 lb

## 2021-07-12 DIAGNOSIS — R739 Hyperglycemia, unspecified: Secondary | ICD-10-CM | POA: Diagnosis not present

## 2021-07-12 DIAGNOSIS — Z6835 Body mass index (BMI) 35.0-35.9, adult: Secondary | ICD-10-CM

## 2021-07-12 DIAGNOSIS — Z13220 Encounter for screening for lipoid disorders: Secondary | ICD-10-CM | POA: Diagnosis not present

## 2021-07-12 DIAGNOSIS — J4541 Moderate persistent asthma with (acute) exacerbation: Secondary | ICD-10-CM

## 2021-07-12 DIAGNOSIS — R059 Cough, unspecified: Secondary | ICD-10-CM | POA: Diagnosis not present

## 2021-07-12 MED ORDER — PREDNISONE 20 MG PO TABS: 20.0000 mg | ORAL_TABLET | Freq: Every day | ORAL | 0 refills | Status: DC

## 2021-07-12 MED ORDER — DOXYCYCLINE HYCLATE 100 MG PO TABS: 100.0000 mg | ORAL_TABLET | Freq: Two times a day (BID) | ORAL | 0 refills | Status: AC

## 2021-07-12 MED ORDER — ALBUTEROL SULFATE HFA 108 (90 BASE) MCG/ACT IN AERS: 1.0000 | INHALATION_SPRAY | Freq: Four times a day (QID) | RESPIRATORY_TRACT | 0 refills | Status: DC | PRN

## 2021-07-12 NOTE — Telephone Encounter (Signed)
Patient asked me about a prescription for Symbicort. Patient states her and Dr.Koberlein did discuss it during appointment but it was not listed on [facility].   Please send to  [id number] [place], [place] - [address] Phone:  [phone]  Fax:  [phone]       Please advise

## 2021-07-12 NOTE — Addendum Note (Signed)
Addended by: Wynn Banker on: 07/12/2021 11:06 AM   Modules accepted: Orders

## 2021-07-12 NOTE — Progress Notes (Signed)
Chelsea Drake DOB: January 30, 1976 Encounter date: 07/12/2021  This is a 45 y.o. female who presents with Chief Complaint  Patient presents with   Cough    Non-productive x2 weeks, productive with clear sputum noted today, using Benzonatate and Promethazine DM with relief    History of present illness:  Did have diarrhea yesterday morning. Didn't have much appetite for rest of day. Had soup, biscuit.took premethazine last night. Had breakfast, lunch and stomach feels better.   2 weeks ago yesterday woke up feeling a little run down, tired. Later in evening developed cough. Next day same sx, body tired. Covid test negative. Thursday felt fine, just had this cough. Started taking symbicort; started taking 2 puffs bid of symbicort after I advised it through [employer].   Coughing fits that last 1-2 minutes; sound terrible. Then has had some days with minimal sx. Then from 6-8pm sets in with cough. Yesterday was coughing most of day. Last night was bad. From dinner time on. Tessalon perles, robitussin did nothing for 3 hours. Then took promethazine which finally knocked her out. It had been about a week since taking the promethazine last. Cough drops are helping.   No fevers. Has been blowing nose some, but it has been clear.   Wants to loose weight. Wondering about best diet for her. She looses well with low carb but hard to loose with any other diet. Does feel that once she is able to loose she will be able to maintain. Just wondering best way/healthiest way to achieve goal.   No Known Allergies Current Meds  Medication Sig   benzonatate (TESSALON) 100 MG capsule Take 1 capsule (100 mg total) by mouth 3 (three) times daily as needed.   budesonide-formoterol (SYMBICORT) 160-4.5 MCG/ACT inhaler TAKE 2 PUFFS BY MOUTH TWICE A DAY   CALCIUM PO Take by mouth daily.   cetirizine (ZYRTEC) 5 MG tablet Take 5 mg by mouth daily.   cyclobenzaprine (FLEXERIL) 5 MG tablet Take 1 tablet (5 mg total) by  mouth 3 (three) times daily as needed for muscle spasms.   doxycycline (VIBRA-TABS) 100 MG tablet Take 1 tablet (100 mg total) by mouth 2 (two) times daily for 7 days.   Fluticasone Propionate (FLONASE NA) Place into the nose.   hydrOXYzine (ATARAX/VISTARIL) 25 MG tablet TAKE 1/2 -1 TABLETS BY MOUTH DAILY AS NEEDED FOR ANXIETY.   Montelukast Sodium (SINGULAIR PO) Take by mouth.   Multiple Vitamin (MULTIVITAMIN WITH MINERALS) TABS tablet Take 1 tablet by mouth daily.   Norethindrone-Ethinyl Estradiol-Fe Biphas (LO LOESTRIN FE) 1 MG-10 MCG / 10 MCG tablet Take by mouth.   predniSONE (DELTASONE) 20 MG tablet Take 1 tablet (20 mg total) by mouth daily with breakfast.   promethazine-dextromethorphan (PROMETHAZINE-DM) 6.25-15 MG/5ML syrup Take 10 mLs by mouth 4 (four) times daily as needed for cough.   [DISCONTINUED] albuterol (VENTOLIN HFA) 108 (90 Base) MCG/ACT inhaler Inhale 1-2 puffs into the lungs every 6 (six) hours as needed for wheezing or shortness of breath.    Review of Systems  Constitutional:  Negative for chills, fatigue and fever.  HENT:  Negative for congestion.   Respiratory:  Positive for cough. Negative for chest tightness, shortness of breath and wheezing.   Cardiovascular:  Negative for chest pain, palpitations and leg swelling.   Objective:  BP 112/78 (BP Location: Right Arm, Patient Position: Sitting, Cuff Size: Large)    Pulse 91    Temp 98.1 F (36.7 C) (Oral)    Ht 5' 4.5" (  1.638 m)    Wt 207 lb 3.2 oz (94 kg)    LMP 07/06/2021 (Exact Date)    SpO2 98%    BMI 35.02 kg/m   Weight: 207 lb 3.2 oz (94 kg)   BP Readings from Last 3 Encounters:  07/12/21 112/78  11/11/19 112/90  01/21/19 102/80   Wt Readings from Last 3 Encounters:  07/12/21 207 lb 3.2 oz (94 kg)  03/24/21 196 lb (88.9 kg)  11/11/19 196 lb 1.6 oz (89 kg)    Physical Exam Constitutional:      General: She is not in acute distress.    Appearance: She is well-developed and overweight.   Cardiovascular:     Rate and Rhythm: Normal rate and regular rhythm.     Heart sounds: Normal heart sounds. No murmur heard.   No friction rub.  Pulmonary:     Effort: Pulmonary effort is normal. No respiratory distress.     Breath sounds: Decreased breath sounds (slightly decreased overall) present. No wheezing or rales.  Musculoskeletal:     Right lower leg: No edema.     Left lower leg: No edema.  Neurological:     Mental Status: She is alert and oriented to person, place, and time.  Psychiatric:        Behavior: Behavior normal.    Assessment/Plan 1. Cough, unspecified type - DG Chest 2 View; Future  2. Moderate persistent asthma with exacerbation Continue with symbicort 2 puffs BID. We discussed that since she just recently increased this amount, it will take some time to kick in. She prefers to hold off on prednisone, but if not noting improvement in cough in the next week, encouraged short burst.  - albuterol (VENTOLIN HFA) 108 (90 Base) MCG/ACT inhaler; Inhale 1-2 puffs into the lungs every 6 (six) hours as needed for wheezing or shortness of breath.  Dispense: 18 g; Refill: 0 - CBC with Differential/Platelet; Future - CBC with Differential/Platelet  3. Class 2 severe obesity due to excess calories with serious comorbidity and body mass index (BMI) of 35.0 to 35.9 in adult [facility]) Interested in weight loss options. Discussed glp-1 today. She has tried phentermine and it made her very anxious, caused insomnia.  - TSH; Future - TSH  4. Hyperglycemia - Hemoglobin A1c; Future - Hemoglobin A1c  5. Lipid screening - Comprehensive metabolic panel; Future - Lipid panel; Future - Lipid panel - Comprehensive metabolic panel   Return if symptoms worsen or fail to improve.  35 minutes spent in chart review,time with patient, exam, charting, follow up plan.   Theodis Shove, MD

## 2021-07-13 ENCOUNTER — Other Ambulatory Visit: Payer: Self-pay | Admitting: Family Medicine

## 2021-07-13 ENCOUNTER — Telehealth: Payer: BC Managed Care – PPO | Admitting: Physician Assistant

## 2021-07-13 DIAGNOSIS — B370 Candidal stomatitis: Secondary | ICD-10-CM | POA: Diagnosis not present

## 2021-07-13 LAB — CBC WITH DIFFERENTIAL/PLATELET
Basophils Absolute: 0.1 10*3/uL (ref 0.0–0.1)
Basophils Relative: 1.2 % (ref 0.0–3.0)
Eosinophils Absolute: 0.2 10*3/uL (ref 0.0–0.7)
Eosinophils Relative: 2 % (ref 0.0–5.0)
HCT: 40.7 % (ref 36.0–46.0)
Hemoglobin: 13.6 g/dL (ref 12.0–15.0)
Lymphocytes Relative: 35.8 % (ref 12.0–46.0)
Lymphs Abs: 3.5 10*3/uL (ref 0.7–4.0)
MCHC: 33.3 g/dL (ref 30.0–36.0)
MCV: 90.9 fl (ref 78.0–100.0)
Monocytes Absolute: 0.6 10*3/uL (ref 0.1–1.0)
Monocytes Relative: 5.9 % (ref 3.0–12.0)
Neutro Abs: 5.4 10*3/uL (ref 1.4–7.7)
Neutrophils Relative %: 55.1 % (ref 43.0–77.0)
Platelets: 553 10*3/uL — ABNORMAL HIGH (ref 150.0–400.0)
RBC: 4.48 Mil/uL (ref 3.87–5.11)
RDW: 13.2 % (ref 11.5–15.5)
WBC: 9.8 10*3/uL (ref 4.0–10.5)

## 2021-07-13 LAB — LIPID PANEL
Cholesterol: 238 mg/dL — ABNORMAL HIGH (ref 0–200)
HDL: 74 mg/dL (ref >39.00)
NonHDL: 164.11
Total CHOL/HDL Ratio: 3
Triglycerides: 223 mg/dL — ABNORMAL HIGH (ref 0.0–149.0)
VLDL: 44.6 mg/dL — ABNORMAL HIGH (ref 0.0–40.0)

## 2021-07-13 LAB — COMPREHENSIVE METABOLIC PANEL
ALT: 26 U/L (ref 0–35)
AST: 28 U/L (ref 0–37)
Albumin: 4 g/dL (ref 3.5–5.2)
Alkaline Phosphatase: 47 U/L (ref 39–117)
BUN: 14 mg/dL (ref 6–23)
CO2: 28 mEq/L (ref 19–32)
Calcium: 9.7 mg/dL (ref 8.4–10.5)
Chloride: 99 mEq/L (ref 96–112)
Creatinine, Ser: 1.09 mg/dL (ref 0.40–1.20)
GFR: 61.95 mL/min (ref >60.00)
Glucose, Bld: 83 mg/dL (ref 70–99)
Potassium: 4.1 mEq/L (ref 3.5–5.1)
Sodium: 137 mEq/L (ref 135–145)
Total Bilirubin: 0.2 mg/dL (ref 0.2–1.2)
Total Protein: 7.7 g/dL (ref 6.0–8.3)

## 2021-07-13 LAB — TSH: TSH: 1.24 u[IU]/mL (ref 0.35–5.50)

## 2021-07-13 LAB — LDL CHOLESTEROL, DIRECT: Direct LDL: 144 mg/dL

## 2021-07-13 LAB — HEMOGLOBIN A1C: Hgb A1c MFr Bld: 5.4 % (ref 4.6–6.5)

## 2021-07-13 MED ORDER — BUDESONIDE-FORMOTEROL FUMARATE 160-4.5 MCG/ACT IN AERO
INHALATION_SPRAY | RESPIRATORY_TRACT | 5 refills | Status: DC
Start: 2021-07-13 — End: 2021-08-28

## 2021-07-13 MED ORDER — NYSTATIN 100000 UNIT/ML MT SUSP: 5.0000 mL | Freq: Four times a day (QID) | OROMUCOSAL | 0 refills | Status: DC | PRN

## 2021-07-13 NOTE — Progress Notes (Signed)
[name], kemp are scheduled for a virtual visit with your provider today.    Just as we do with appointments in the office, we must obtain your consent to participate.  Your consent will be active for this visit and any virtual visit you may have with one of our providers in the next 365 days.    If you have a MyChart account, I can also send a copy of this consent to you electronically.  All virtual visits are billed to your insurance company just like a traditional visit in the office.  As this is a virtual visit, video technology does not allow for your provider to perform a traditional examination.  This may limit your provider's ability to fully assess your condition.  If your provider identifies any concerns that need to be evaluated in person or the need to arrange testing such as labs, EKG, etc, we will make arrangements to do so.    Although advances in technology are sophisticated, we cannot ensure that it will always work on either your end or our end.  If the connection with a video visit is poor, we may have to switch to a telephone visit.  With either a video or telephone visit, we are not always able to ensure that we have a secure connection.   I need to obtain your verbal consent now.   Are you willing to proceed with your visit today?   Chelsea Drake has provided verbal consent on 07/13/2021 for a virtual visit (video or telephone).   Chelsea Forth, PA-C 07/13/2021  2:46 PM   Date:  07/13/2021   ID:  Chelsea Drake, DOB 1976-03-19, MRN [id number]  Patient Location: Home Provider Location: Home Office   Participants: Patient and Provider for Visit and Wrap up  Method of visit: Video  Location of Patient: Home Location of Provider: Home Office Consent was obtain for visit over the video. Services rendered by provider: Visit was performed via video  A video enabled telemedicine application was used and I verified that I am speaking with the correct person  using two identifiers.  PCP:  Wynn Banker, MD   Chief Complaint:  thrush  History of Present Illness:    Chelsea Drake is a 45 y.o. female with history as stated below. Presents video telehealth for an acute care visit.  Pt with a hx of asthma.  2 weeks ago with URI symptoms.  Was treating this with ventolin and Symbicort.  Pt reports she didn't wash her mouth after every dose and is concerned she might have oral thrush.  Pt reports she has her smell but yesterday her coffee tasted strange.  She reports other things have also tasted funny.  Pt reports her tongue feels strange as well.  Mild sore throat but no difficulty speaking or swallowing.  Pt also reports her tongue is also white.  NO other aggravating or alleviating factors.  Pt did see her PCP yesterday and received a Rx for prednisone and doxycycline.  No treatments for the thrush.  COVID test negative.  No other aggravating or relieving factors.  No other c/o.  Past Medical, Surgical, Social History, Allergies, and Medications have been Reviewed.  Patient Active Problem List   Diagnosis Date Noted   Asthma 01/21/2019   Anxiety 01/21/2019   Seasonal allergies 01/21/2019    Social History   Tobacco Use   Smoking status: Never   Smokeless tobacco: Never  Substance Use Topics   Alcohol use:  Yes     Current Outpatient Medications:    magic mouthwash (nystatin, hydrocortisone, diphenhydrAMINE) suspension, Swish and swallow 5 mLs 4 (four) times daily as needed for mouth pain., Disp: 540 mL, Rfl: 0   albuterol (VENTOLIN HFA) 108 (90 Base) MCG/ACT inhaler, Inhale 1-2 puffs into the lungs every 6 (six) hours as needed for wheezing or shortness of breath., Disp: 18 g, Rfl: 0   benzonatate (TESSALON) 100 MG capsule, Take 1 capsule (100 mg total) by mouth 3 (three) times daily as needed., Disp: 30 capsule, Rfl: 0   budesonide-formoterol (SYMBICORT) 160-4.5 MCG/ACT inhaler, TAKE 2 PUFFS BY MOUTH TWICE A DAY, Disp: 10.2  each, Rfl: 0   CALCIUM PO, Take by mouth daily., Disp: , Rfl:    cetirizine (ZYRTEC) 5 MG tablet, Take 5 mg by mouth daily., Disp: , Rfl:    cyclobenzaprine (FLEXERIL) 5 MG tablet, Take 1 tablet (5 mg total) by mouth 3 (three) times daily as needed for muscle spasms., Disp: 30 tablet, Rfl: 0   doxycycline (VIBRA-TABS) 100 MG tablet, Take 1 tablet (100 mg total) by mouth 2 (two) times daily for 7 days., Disp: 14 tablet, Rfl: 0   Fluticasone Propionate (FLONASE NA), Place into the nose., Disp: , Rfl:    hydrOXYzine (ATARAX/VISTARIL) 25 MG tablet, TAKE 1/2 -1 TABLETS BY MOUTH DAILY AS NEEDED FOR ANXIETY., Disp: 90 tablet, Rfl: 1   Montelukast Sodium (SINGULAIR PO), Take by mouth., Disp: , Rfl:    Multiple Vitamin (MULTIVITAMIN WITH MINERALS) TABS tablet, Take 1 tablet by mouth daily., Disp: , Rfl:    Norethindrone-Ethinyl Estradiol-Fe Biphas (LO LOESTRIN FE) 1 MG-10 MCG / 10 MCG tablet, Take by mouth., Disp: , Rfl:    predniSONE (DELTASONE) 20 MG tablet, Take 1 tablet (20 mg total) by mouth daily with breakfast., Disp: 10 tablet, Rfl: 0   promethazine-dextromethorphan (PROMETHAZINE-DM) 6.25-15 MG/5ML syrup, Take 10 mLs by mouth 4 (four) times daily as needed for cough., Disp: 118 mL, Rfl: 0   No Known Allergies   Review of Systems  Constitutional:  Negative for chills and fever.  HENT:  Negative for congestion, ear pain and sore throat.        White patches on tongue  Eyes:  Negative for blurred vision and double vision.  Respiratory:  Negative for cough, shortness of breath and wheezing.   Cardiovascular:  Negative for chest pain, palpitations and leg swelling.  Gastrointestinal:  Negative for abdominal pain, diarrhea, nausea and vomiting.  Genitourinary:  Negative for dysuria.  Musculoskeletal:  Negative for myalgias.  Skin:  Negative for rash.  Neurological:  Negative for loss of consciousness, weakness and headaches.  Psychiatric/Behavioral:  The patient is not nervous/anxious.   See HPI  for history of present illness.  Physical Exam Constitutional:      General: She is not in acute distress.    Appearance: Normal appearance. She is not ill-appearing.  HENT:     Head: Normocephalic and atraumatic.     Nose: No congestion.     Mouth/Throat:     Mouth: Mucous membranes are moist. Oral lesions present.     Tongue: Lesions (white plaques) present.   Eyes:     Extraocular Movements: Extraocular movements intact.  Pulmonary:     Effort: Pulmonary effort is normal.  Musculoskeletal:        General: Normal range of motion.     Cervical back: Normal range of motion.  Skin:    Coloration: Skin is not pale.  Neurological:  General: No focal deficit present.     Mental Status: She is alert. Mental status is at baseline.  Psychiatric:        Mood and Affect: Mood normal.              A&P  1. Oral thrush  - magic mouthwash  - use spacer with your inhaler   Patient voiced understanding and agreement to plan.   Time:   Today, I have spent 15 minutes with the patient with telehealth technology discussing the above problems, reviewing the chart, previous notes, medications and orders.    Tests Ordered: No orders of the defined types were placed in this encounter.   Medication Changes: Meds ordered this encounter  Medications   magic mouthwash (nystatin, hydrocortisone, diphenhydrAMINE) suspension    Sig: Swish and swallow 5 mLs 4 (four) times daily as needed for mouth pain.    Dispense:  540 mL    Refill:  0     Disposition:  Follow up PCP as needed  Signed, Chelsea Forth, PA-C  07/13/2021 2:57 PM

## 2021-07-13 NOTE — Patient Instructions (Signed)
1. Oral thrush  - magic mouthwash  - use spacer with your inhaler

## 2021-07-13 NOTE — Telephone Encounter (Signed)
I have sent symbicort. Sorry I missed that in office; I thought she just needed ventolin.

## 2021-07-14 ENCOUNTER — Encounter: Payer: Self-pay | Admitting: Family Medicine

## 2021-07-14 NOTE — Telephone Encounter (Signed)
Patient informed of the message below.

## 2021-07-20 DIAGNOSIS — F411 Generalized anxiety disorder: Secondary | ICD-10-CM | POA: Diagnosis not present

## 2021-07-25 DIAGNOSIS — F411 Generalized anxiety disorder: Secondary | ICD-10-CM | POA: Diagnosis not present

## 2021-08-01 DIAGNOSIS — F411 Generalized anxiety disorder: Secondary | ICD-10-CM | POA: Diagnosis not present

## 2021-08-07 DIAGNOSIS — F411 Generalized anxiety disorder: Secondary | ICD-10-CM | POA: Diagnosis not present

## 2021-08-10 ENCOUNTER — Telehealth: Payer: Self-pay | Admitting: Family Medicine

## 2021-08-10 NOTE — Telephone Encounter (Signed)
Pt last seen dr Ethlyn Gallery on 07-12-2021 and would like to get a rx for saxenda please send to  [place], [place] Phone:  [phone]  Fax:  [phone]

## 2021-08-11 ENCOUNTER — Other Ambulatory Visit: Payer: Self-pay | Admitting: Family Medicine

## 2021-08-11 MED ORDER — SAXENDA 18 MG/3ML ~~LOC~~ SOPN: PEN_INJECTOR | SUBCUTANEOUS | 5 refills | Status: AC

## 2021-08-11 NOTE — Telephone Encounter (Signed)
Med sent in for her.

## 2021-08-12 ENCOUNTER — Other Ambulatory Visit: Payer: Self-pay | Admitting: Family Medicine

## 2021-08-12 DIAGNOSIS — J4541 Moderate persistent asthma with (acute) exacerbation: Secondary | ICD-10-CM

## 2021-08-15 DIAGNOSIS — F411 Generalized anxiety disorder: Secondary | ICD-10-CM | POA: Diagnosis not present

## 2021-08-23 DIAGNOSIS — F411 Generalized anxiety disorder: Secondary | ICD-10-CM | POA: Diagnosis not present

## 2021-08-28 ENCOUNTER — Ambulatory Visit (INDEPENDENT_AMBULATORY_CARE_PROVIDER_SITE_OTHER): Payer: BC Managed Care – PPO | Admitting: Family Medicine

## 2021-08-28 ENCOUNTER — Encounter: Payer: Self-pay | Admitting: Family Medicine

## 2021-08-28 VITALS — BP 116/80 | HR 77 | Temp 98.7°F | Ht 64.0 in | Wt 208.6 lb

## 2021-08-28 DIAGNOSIS — Z0000 Encounter for general adult medical examination without abnormal findings: Secondary | ICD-10-CM

## 2021-08-28 DIAGNOSIS — J4520 Mild intermittent asthma, uncomplicated: Secondary | ICD-10-CM

## 2021-08-28 NOTE — Progress Notes (Signed)
Chelsea Drake DOB: [birth year] Encounter date: 08/28/2021  This is a 46 y.o. female who presents for complete physical   History of present illness/Additional concerns:  She is doing well. Breathing is feeling better now. Ended up taking antibiotic as sinuses significantly worsened and this helped very quickly once starting. No inhaler use right now. Albuterol prn. Weaned off symbicort.   Follows with derm but changing to derm specialists and has appt in a couple of weeks to see them.   Sees Dr. Pamala Hurry for gyn care. Last mammogram normal 09/13/20. Has visit in 2 weeks. Gets mammogram through their office. Taking birth control regularly.  Has access to [place] through work [employer].    Past Medical History:  Diagnosis Date   Allergy    Asthma    Past Surgical History:  Procedure Laterality Date   FINGER SURGERY Right    5th digit   No Known Allergies Current Meds  Medication Sig   CALCIUM PO Take by mouth daily.   cetirizine (ZYRTEC) 5 MG tablet Take 5 mg by mouth daily.   Fluticasone Propionate (FLONASE NA) Place into the nose.   hydrOXYzine (ATARAX/VISTARIL) 25 MG tablet TAKE 1/2 -1 TABLETS BY MOUTH DAILY AS NEEDED FOR ANXIETY.   Liraglutide -Weight Management (SAXENDA) 18 MG/3ML SOPN Inject 0.6 mg into the skin daily for 7 days, THEN 1.2 mg daily for 7 days, THEN 1.8 mg daily for 7 days, THEN 2.4 mg daily for 7 days, THEN 3 mg daily.   Montelukast Sodium (SINGULAIR PO) Take by mouth.   Multiple Vitamin (MULTIVITAMIN WITH MINERALS) TABS tablet Take 1 tablet by mouth daily.   Norethindrone-Ethinyl Estradiol-Fe Biphas (LO LOESTRIN FE) 1 MG-10 MCG / 10 MCG tablet Take by mouth.   VENTOLIN HFA 108 (90 Base) MCG/ACT inhaler INHALE 1-2 PUFFS BY MOUTH EVERY 6 HOURS AS NEEDED FOR WHEEZE OR SHORTNESS OF BREATH   [DISCONTINUED] benzonatate (TESSALON) 100 MG capsule Take 1 capsule (100 mg total) by mouth 3 (three) times daily as needed.   [DISCONTINUED] budesonide-formoterol  (SYMBICORT) 160-4.5 MCG/ACT inhaler TAKE 2 PUFFS BY MOUTH TWICE A DAY   [DISCONTINUED] cyclobenzaprine (FLEXERIL) 5 MG tablet Take 1 tablet (5 mg total) by mouth 3 (three) times daily as needed for muscle spasms.   [DISCONTINUED] magic mouthwash (nystatin, hydrocortisone, diphenhydrAMINE) suspension Swish and swallow 5 mLs 4 (four) times daily as needed for mouth pain.   [DISCONTINUED] predniSONE (DELTASONE) 20 MG tablet Take 1 tablet (20 mg total) by mouth daily with breakfast.   [DISCONTINUED] promethazine-dextromethorphan (PROMETHAZINE-DM) 6.25-15 MG/5ML syrup Take 10 mLs by mouth 4 (four) times daily as needed for cough.   Social History   Tobacco Use   Smoking status: Never   Smokeless tobacco: Never  Substance Use Topics   Alcohol use: Yes   Family History  Problem Relation Age of Onset   Asthma Father    Hypertrophic cardiomyopathy Father      Review of Systems  Constitutional:  Negative for activity change, appetite change, chills, fatigue, fever and unexpected weight change.  HENT:  Negative for congestion, ear pain, hearing loss, sinus pressure, sinus pain, sore throat and trouble swallowing.   Eyes:  Negative for pain and visual disturbance.  Respiratory:  Negative for cough, chest tightness, shortness of breath and wheezing.   Cardiovascular:  Negative for chest pain, palpitations and leg swelling.  Gastrointestinal:  Negative for abdominal pain, blood in stool, constipation, diarrhea, nausea and vomiting.  Genitourinary:  Negative for difficulty urinating and menstrual  problem.  Musculoskeletal:  Negative for arthralgias and back pain.  Skin:  Negative for rash.  Neurological:  Negative for dizziness, weakness, numbness and headaches.  Hematological:  Negative for adenopathy. Does not bruise/bleed easily.  Psychiatric/Behavioral:  Negative for sleep disturbance and suicidal ideas. The patient is not nervous/anxious.    CBC:  Lab Results  Component Value Date   WBC  9.8 07/12/2021   HGB 13.6 07/12/2021   HCT 40.7 07/12/2021   MCHC 33.3 07/12/2021   RDW 13.2 07/12/2021   PLT 553.0 (H) 07/12/2021   CMP: Lab Results  Component Value Date   NA 137 07/12/2021   K 4.1 07/12/2021   CL 99 07/12/2021   CO2 28 07/12/2021   GLUCOSE 83 07/12/2021   BUN 14 07/12/2021   CREATININE 1.09 07/12/2021   CALCIUM 9.7 07/12/2021   PROT 7.7 07/12/2021   BILITOT 0.2 07/12/2021   ALKPHOS 47 07/12/2021   ALT 26 07/12/2021   AST 28 07/12/2021   LIPID: Lab Results  Component Value Date   CHOL 238 (H) 07/12/2021   TRIG 223.0 (H) 07/12/2021   HDL 74.00 07/12/2021    Objective:  BP 116/80 (BP Location: Left Arm, Patient Position: Sitting, Cuff Size: Large)    Pulse 77    Temp 98.7 F (37.1 C) (Oral)    Ht 5\' 4"  (1.626 m)    Wt 208 lb 9.6 oz (94.6 kg)    SpO2 97%    BMI 35.81 kg/m   Weight: 208 lb 9.6 oz (94.6 kg)   BP Readings from Last 3 Encounters:  08/28/21 116/80  07/12/21 112/78  11/11/19 112/90   Wt Readings from Last 3 Encounters:  08/28/21 208 lb 9.6 oz (94.6 kg)  07/12/21 207 lb 3.2 oz (94 kg)  03/24/21 196 lb (88.9 kg)    Physical Exam Constitutional:      General: She is not in acute distress.    Appearance: She is well-developed.  HENT:     Head: Normocephalic and atraumatic.     Right Ear: External ear normal.     Left Ear: External ear normal.     Mouth/Throat:     Pharynx: No oropharyngeal exudate.  Eyes:     Conjunctiva/sclera: Conjunctivae normal.     Pupils: Pupils are equal, round, and reactive to light.  Neck:     Thyroid: No thyromegaly.  Cardiovascular:     Rate and Rhythm: Normal rate and regular rhythm.     Heart sounds: Normal heart sounds. No murmur heard.   No friction rub. No gallop.  Pulmonary:     Effort: Pulmonary effort is normal.     Breath sounds: Normal breath sounds.  Abdominal:     General: Bowel sounds are normal. There is no distension.     Palpations: Abdomen is soft. There is no mass.      Tenderness: There is no abdominal tenderness. There is no guarding.     Hernia: No hernia is present.  Musculoskeletal:        General: No tenderness or deformity. Normal range of motion.     Cervical back: Normal range of motion and neck supple.  Lymphadenopathy:     Cervical: No cervical adenopathy.  Skin:    General: Skin is warm and dry.     Findings: No rash.  Neurological:     Mental Status: She is alert and oriented to person, place, and time.     Deep Tendon Reflexes: Reflexes normal.  Reflex Scores:      Tricep reflexes are 2+ on the right side and 2+ on the left side.      Bicep reflexes are 2+ on the right side and 2+ on the left side.      Brachioradialis reflexes are 2+ on the right side and 2+ on the left side.      Patellar reflexes are 2+ on the right side and 2+ on the left side. Psychiatric:        Speech: Speech normal.        Behavior: Behavior normal.        Thought Content: Thought content normal.    Assessment/Plan: Health Maintenance Due  Topic Date Due   PAP SMEAR-Modifier  07/16/2021   Health Maintenance reviewed.    1. Preventative health care Keep up with healthy lifestyle, regular exercise. Encouraged to pursue Virta.   2. Mild intermittent asthma without complication Doing well now.    Return in about 6 months (around 02/25/2022) for Chronic condition visit.  Micheline Rough, MD

## 2021-08-29 DIAGNOSIS — E669 Obesity, unspecified: Secondary | ICD-10-CM | POA: Diagnosis not present

## 2021-08-30 DIAGNOSIS — F411 Generalized anxiety disorder: Secondary | ICD-10-CM | POA: Diagnosis not present

## 2021-09-05 DIAGNOSIS — F411 Generalized anxiety disorder: Secondary | ICD-10-CM | POA: Diagnosis not present

## 2021-09-11 ENCOUNTER — Telehealth: Payer: Self-pay | Admitting: Family Medicine

## 2021-09-11 NOTE — Telephone Encounter (Signed)
Pt is calling and Liraglutide -Weight Management (SAXENDA) 18 MG/3ML SOPN needs PA . Pt will have [facility] refax PA

## 2021-09-12 DIAGNOSIS — D224 Melanocytic nevi of scalp and neck: Secondary | ICD-10-CM | POA: Diagnosis not present

## 2021-09-12 DIAGNOSIS — D225 Melanocytic nevi of trunk: Secondary | ICD-10-CM | POA: Diagnosis not present

## 2021-09-12 DIAGNOSIS — Z23 Encounter for immunization: Secondary | ICD-10-CM | POA: Diagnosis not present

## 2021-09-12 DIAGNOSIS — D2261 Melanocytic nevi of right upper limb, including shoulder: Secondary | ICD-10-CM | POA: Diagnosis not present

## 2021-09-12 DIAGNOSIS — L814 Other melanin hyperpigmentation: Secondary | ICD-10-CM | POA: Diagnosis not present

## 2021-09-12 NOTE — Telephone Encounter (Signed)
Prior auth for Saxenda 18mg  sent to [id number] pending review by the patients insurance.

## 2021-09-13 DIAGNOSIS — F411 Generalized anxiety disorder: Secondary | ICD-10-CM | POA: Diagnosis not present

## 2021-09-18 DIAGNOSIS — Z1231 Encounter for screening mammogram for malignant neoplasm of breast: Secondary | ICD-10-CM | POA: Diagnosis not present

## 2021-09-18 DIAGNOSIS — Z124 Encounter for screening for malignant neoplasm of cervix: Secondary | ICD-10-CM | POA: Diagnosis not present

## 2021-09-18 DIAGNOSIS — H04123 Dry eye syndrome of bilateral lacrimal glands: Secondary | ICD-10-CM | POA: Diagnosis not present

## 2021-09-18 DIAGNOSIS — Z01419 Encounter for gynecological examination (general) (routine) without abnormal findings: Secondary | ICD-10-CM | POA: Diagnosis not present

## 2021-09-18 DIAGNOSIS — H5213 Myopia, bilateral: Secondary | ICD-10-CM | POA: Diagnosis not present

## 2021-09-18 DIAGNOSIS — Z6835 Body mass index (BMI) 35.0-35.9, adult: Secondary | ICD-10-CM | POA: Diagnosis not present

## 2021-09-18 LAB — HM MAMMOGRAPHY

## 2021-09-19 ENCOUNTER — Encounter: Payer: Self-pay | Admitting: Family Medicine

## 2021-09-19 DIAGNOSIS — M9902 Segmental and somatic dysfunction of thoracic region: Secondary | ICD-10-CM | POA: Diagnosis not present

## 2021-09-19 DIAGNOSIS — M5441 Lumbago with sciatica, right side: Secondary | ICD-10-CM | POA: Diagnosis not present

## 2021-09-19 DIAGNOSIS — M9903 Segmental and somatic dysfunction of lumbar region: Secondary | ICD-10-CM | POA: Diagnosis not present

## 2021-09-19 DIAGNOSIS — M9905 Segmental and somatic dysfunction of pelvic region: Secondary | ICD-10-CM | POA: Diagnosis not present

## 2021-09-22 DIAGNOSIS — F411 Generalized anxiety disorder: Secondary | ICD-10-CM | POA: Diagnosis not present

## 2021-09-25 DIAGNOSIS — L649 Androgenic alopecia, unspecified: Secondary | ICD-10-CM | POA: Diagnosis not present

## 2021-09-26 DIAGNOSIS — E669 Obesity, unspecified: Secondary | ICD-10-CM | POA: Diagnosis not present

## 2021-09-26 LAB — HM PAP SMEAR: HPV, high-risk: NEGATIVE

## 2021-10-10 DIAGNOSIS — F411 Generalized anxiety disorder: Secondary | ICD-10-CM | POA: Diagnosis not present

## 2021-10-17 DIAGNOSIS — F411 Generalized anxiety disorder: Secondary | ICD-10-CM | POA: Diagnosis not present

## 2021-10-26 DIAGNOSIS — Z79899 Other long term (current) drug therapy: Secondary | ICD-10-CM | POA: Diagnosis not present

## 2021-10-26 DIAGNOSIS — L649 Androgenic alopecia, unspecified: Secondary | ICD-10-CM | POA: Diagnosis not present

## 2021-10-26 DIAGNOSIS — F411 Generalized anxiety disorder: Secondary | ICD-10-CM | POA: Diagnosis not present

## 2021-10-27 DIAGNOSIS — E669 Obesity, unspecified: Secondary | ICD-10-CM | POA: Diagnosis not present

## 2021-11-02 DIAGNOSIS — F411 Generalized anxiety disorder: Secondary | ICD-10-CM | POA: Diagnosis not present

## 2021-11-09 DIAGNOSIS — F411 Generalized anxiety disorder: Secondary | ICD-10-CM | POA: Diagnosis not present

## 2021-11-15 DIAGNOSIS — F411 Generalized anxiety disorder: Secondary | ICD-10-CM | POA: Diagnosis not present

## 2021-11-23 DIAGNOSIS — F411 Generalized anxiety disorder: Secondary | ICD-10-CM | POA: Diagnosis not present

## 2021-11-24 DIAGNOSIS — R051 Acute cough: Secondary | ICD-10-CM | POA: Diagnosis not present

## 2021-11-24 DIAGNOSIS — J069 Acute upper respiratory infection, unspecified: Secondary | ICD-10-CM | POA: Diagnosis not present

## 2021-11-24 DIAGNOSIS — J029 Acute pharyngitis, unspecified: Secondary | ICD-10-CM | POA: Diagnosis not present

## 2021-11-26 DIAGNOSIS — E669 Obesity, unspecified: Secondary | ICD-10-CM | POA: Diagnosis not present

## 2021-11-30 DIAGNOSIS — F411 Generalized anxiety disorder: Secondary | ICD-10-CM | POA: Diagnosis not present

## 2021-12-07 ENCOUNTER — Encounter: Payer: Self-pay | Admitting: Family Medicine

## 2021-12-08 ENCOUNTER — Ambulatory Visit (INDEPENDENT_AMBULATORY_CARE_PROVIDER_SITE_OTHER): Payer: BC Managed Care – PPO | Admitting: Family Medicine

## 2021-12-08 ENCOUNTER — Encounter: Payer: Self-pay | Admitting: Family Medicine

## 2021-12-08 VITALS — BP 130/84 | HR 67 | Temp 97.9°F | Ht 65.0 in | Wt 191.4 lb

## 2021-12-08 DIAGNOSIS — J4521 Mild intermittent asthma with (acute) exacerbation: Secondary | ICD-10-CM | POA: Diagnosis not present

## 2021-12-08 MED ORDER — BUDESONIDE-FORMOTEROL FUMARATE 80-4.5 MCG/ACT IN AERO: 2.0000 | INHALATION_SPRAY | Freq: Two times a day (BID) | RESPIRATORY_TRACT | 3 refills | Status: DC

## 2021-12-08 MED ORDER — AZITHROMYCIN 250 MG PO TABS: ORAL_TABLET | ORAL | 0 refills | Status: AC

## 2021-12-08 NOTE — Progress Notes (Signed)
Subjective:     Patient ID: Chelsea Drake, female    DOB: 07/21/1975, 46 y.o.   MRN: [id number]  Chief Complaint  Patient presents with   Asthma    Asthma flare-up started last night     HPI Asthma flaring since last pm-mild asthma since child.  At times, flares-like URI.  Takes singulair daily but only takes symbicort for [facility].  Got covid in 2021 and aggrevated asthma so symbicort for sev months.  Covid again in 2022 and symbicort again.  In [facility] and flared asthma so symbicort-1 puff bid not help so inc to 2 puffs bid. Off symbicort since Jan and fine.  Using ventolin about once/wk.  Early May-went to [facility] and got URI-so back on symbicort since 5/11 2 bid and 1 wk later some better.  Still using ventolin once/day w/spacer.  Went to [place] on 5/23.  Now, getting worse again.  Tightness in chest and need to cough.  Used ventolin about 7 x on 5/24.  Yesterday-about 3-4 times-but just doesn't feel like opening up so well.  Out of symbicort past 1-2 days.  Cough-dry.  No f/c Has bad allergies in general. Feels tired.  Covid neg.   Still has rx for pred from dec that didn't need. Seen 5/12 for URI at [facility]  Health Maintenance Due  Topic Date Due   PAP SMEAR-Modifier  07/16/2021    Past Medical History:  Diagnosis Date   Allergy    Asthma     Past Surgical History:  Procedure Laterality Date   FINGER SURGERY Right    5th digit    Outpatient Medications Prior to Visit  Medication Sig Dispense Refill   acetaminophen (TYLENOL) 325 MG tablet Tylenol 325 mg tablet  Take 2 tablets as needed by oral route.     CALCIUM PO Take by mouth daily.     cetirizine (ZYRTEC) 5 MG tablet Take 5 mg by mouth daily.     Fluticasone Propionate (FLONASE NA) Place into the nose.     hydrOXYzine (ATARAX/VISTARIL) 25 MG tablet TAKE 1/2 -1 TABLETS BY MOUTH DAILY AS NEEDED FOR ANXIETY. 90 tablet 1   Montelukast Sodium (SINGULAIR PO) Take by mouth.     Multiple Vitamin (MULTIVITAMIN WITH  MINERALS) TABS tablet Take 1 tablet by mouth daily.     Norethindrone-Ethinyl Estradiol-Fe Biphas (LO LOESTRIN FE) 1 MG-10 MCG / 10 MCG tablet Take by mouth.     ondansetron (ZOFRAN-ODT) 4 MG disintegrating tablet Take by mouth.     Spacer/Aero-Holding Chambers (AEROCHAMBER PLUS FLO-VU LARGE) MISC 1 EACH BY OTHER ROUTE ONCE FOR 1 DOSE.     spironolactone (ALDACTONE) 100 MG tablet Take 100 mg by mouth daily.     VENTOLIN HFA 108 (90 Base) MCG/ACT inhaler INHALE 1-2 PUFFS BY MOUTH EVERY 6 HOURS AS NEEDED FOR WHEEZE OR SHORTNESS OF BREATH 18 each 2   budesonide-formoterol (SYMBICORT) 80-4.5 MCG/ACT inhaler TAKE 2 PUFFS BY MOUTH TWICE A DAY     cyclobenzaprine (FLEXERIL) 5 MG tablet Take by mouth. (Patient not taking: Reported on 12/08/2021)     Norethindrone-Ethinyl Estradiol-Fe Biphas (LO LOESTRIN FE) 1 MG-10 MCG / 10 MCG tablet Take by mouth.     No facility-administered medications prior to visit.    No Known Allergies ROS neg/noncontributory except as noted HPI/below  Hair loss-spironolactone      Objective:     BP 130/84   Pulse 67   Temp 97.9 F (36.6 C) (Temporal)   Ht 5'  5" (1.651 m)   Wt 191 lb 6 oz (86.8 kg)   LMP 11/24/2021 (Approximate)   SpO2 99%   BMI 31.85 kg/m  Wt Readings from Last 3 Encounters:  12/08/21 191 lb 6 oz (86.8 kg)  08/28/21 208 lb 9.6 oz (94.6 kg)  07/12/21 207 lb 3.2 oz (94 kg)    Physical Exam   Gen: WDWN NAD HEENT: NCAT, conjunctiva not injected, sclera nonicteric TM WNL B, OP moist, no exudates  NECK:  supple, no thyromegaly, no nodes, no carotid bruits CARDIAC: RRR, S1S2+, no murmur. DP 2+B LUNGS: CTAB. No wheezes EXT:  no edema MSK: no gross abnormalities.  NEURO: A&O x3.  CN II-XII intact.  PSYCH: normal mood. Good eye contact     Assessment & Plan:   Problem List Items Addressed This Visit       Respiratory   Asthma - Primary   Relevant Medications   budesonide-formoterol (SYMBICORT) 80-4.5 MCG/ACT inhaler   Asthma  w/acute flare-has pred on hand.  Zpk to hold.    Meds ordered this encounter  Medications   budesonide-formoterol (SYMBICORT) 80-4.5 MCG/ACT inhaler    Sig: Inhale 2 puffs into the lungs in the morning and at bedtime.    Dispense:  1 each    Refill:  3   azithromycin (ZITHROMAX) 250 MG tablet    Sig: Take 2 tablets on day 1, then 1 tablet daily on days 2 through 5    Dispense:  6 tablet    Refill:  0    Angelena Sole, MD

## 2021-12-08 NOTE — Patient Instructions (Addendum)
Take prednisone if continues to worsen.  Mucinex ok  Antibiotic if worse

## 2021-12-13 DIAGNOSIS — F411 Generalized anxiety disorder: Secondary | ICD-10-CM | POA: Diagnosis not present

## 2021-12-18 ENCOUNTER — Encounter: Payer: Self-pay | Admitting: Family Medicine

## 2021-12-18 ENCOUNTER — Other Ambulatory Visit: Payer: Self-pay | Admitting: Family Medicine

## 2021-12-18 ENCOUNTER — Ambulatory Visit: Payer: BC Managed Care – PPO | Admitting: Family Medicine

## 2021-12-18 VITALS — BP 124/80 | HR 78 | Temp 98.4°F | Ht 65.0 in | Wt 193.0 lb

## 2021-12-18 DIAGNOSIS — J4521 Mild intermittent asthma with (acute) exacerbation: Secondary | ICD-10-CM

## 2021-12-18 DIAGNOSIS — J4541 Moderate persistent asthma with (acute) exacerbation: Secondary | ICD-10-CM

## 2021-12-18 MED ORDER — GUAIFENESIN-CODEINE 100-10 MG/5ML PO SOLN: 5.0000 mL | Freq: Three times a day (TID) | ORAL | 0 refills | Status: DC | PRN

## 2021-12-18 NOTE — Progress Notes (Signed)
Subjective:     Patient ID: Chelsea Drake, female    DOB: [birth year], 46 y.o.   MRN: [id number]  Chief Complaint  Patient presents with   Transfer of Care   Cough    Persistent cough, possible pulled a muscle from coughing, doing better, but still have cough     HPI  Asthma flare-URI-took the zpk as got sicker the next day..  Coughing a lot.  Nose running a lot. Congestion some better today.  Still coughing.  Soreness R lower ribs from coughing.  Took aleve.  Taking symbicort. 160 2 puffs bid.  Didn't take pred.  Asthma getting "better" as using ventolin less.    Taking mucinex.    Health Maintenance Due  Topic Date Due   PAP SMEAR-Modifier  07/16/2021    Past Medical History:  Diagnosis Date   Allergy    Asthma     Past Surgical History:  Procedure Laterality Date   FINGER SURGERY Right    5th digit    Outpatient Medications Prior to Visit  Medication Sig Dispense Refill   acetaminophen (TYLENOL) 325 MG tablet Tylenol 325 mg tablet  Take 2 tablets as needed by oral route.     CALCIUM PO Take by mouth daily.     cetirizine (ZYRTEC) 5 MG tablet Take 5 mg by mouth daily.     cyclobenzaprine (FLEXERIL) 5 MG tablet Take by mouth.     Fluticasone Propionate (FLONASE NA) Place into the nose.     hydrOXYzine (ATARAX/VISTARIL) 25 MG tablet TAKE 1/2 -1 TABLETS BY MOUTH DAILY AS NEEDED FOR ANXIETY. 90 tablet 1   Montelukast Sodium (SINGULAIR PO) Take by mouth.     Multiple Vitamin (MULTIVITAMIN WITH MINERALS) TABS tablet Take 1 tablet by mouth daily.     Norethindrone-Ethinyl Estradiol-Fe Biphas (LO LOESTRIN FE) 1 MG-10 MCG / 10 MCG tablet Take by mouth.     ondansetron (ZOFRAN-ODT) 4 MG disintegrating tablet Take by mouth.     Spacer/Aero-Holding Chambers (AEROCHAMBER PLUS FLO-VU LARGE) MISC 1 EACH BY OTHER ROUTE ONCE FOR 1 DOSE.     spironolactone (ALDACTONE) 100 MG tablet Take 100 mg by mouth daily.     SYMBICORT 160-4.5 MCG/ACT inhaler SMARTSIG:2 Puff(s) By Mouth  Twice Daily     VENTOLIN HFA 108 (90 Base) MCG/ACT inhaler INHALE 1-2 PUFFS BY MOUTH EVERY 6 HOURS AS NEEDED FOR WHEEZE OR SHORTNESS OF BREATH 18 each 2   fluconazole (DIFLUCAN) 150 MG tablet Take 150 mg by mouth once. (Patient not taking: Reported on 12/18/2021)     promethazine (PHENERGAN) 25 MG tablet Take 25 mg by mouth 3 (three) times daily as needed. (Patient not taking: Reported on 12/18/2021)     budesonide-formoterol (SYMBICORT) 80-4.5 MCG/ACT inhaler Inhale 2 puffs into the lungs in the morning and at bedtime. 1 each 3   No facility-administered medications prior to visit.    No Known Allergies ROS neg/noncontributory except as noted HPI/below      Objective:     BP 124/80   Pulse 78   Temp 98.4 F (36.9 C) (Temporal)   Ht 5\' 5"  (1.651 m)   Wt 193 lb (87.5 kg)   LMP 11/24/2021 (Approximate)   SpO2 97%   BMI 32.12 kg/m  Wt Readings from Last 3 Encounters:  12/18/21 193 lb (87.5 kg)  12/08/21 191 lb 6 oz (86.8 kg)  08/28/21 208 lb 9.6 oz (94.6 kg)    Physical Exam   Gen: WDWN NAD [facility] HEENT:  NCAT, conjunctiva not injected, sclera nonicteric TM WNL B, OP moist, no exudates  NECK:  supple, no thyromegaly, no nodes, no carotid bruits CARDIAC: RRR, S1S2+, no murmur. DP 2+B LUNGS: CTAB. No wheezes.   Chest wall-some TTP R ant rib/cartilage  ABDOMEN:  BS+, soft, NTND, No HSM, no masses EXT:  no edema MSK: no gross abnormalities.  NEURO: A&O x3.  CN II-XII intact.  PSYCH: normal mood. Good eye contact     Assessment & Plan:   Problem List Items Addressed This Visit       Respiratory   Asthma - Primary   Relevant Medications   SYMBICORT 160-4.5 MCG/ACT inhaler   Asthma-exacerbation from URI and now rib pain.  Took abx.  Using symbicort.  Suspect still flaring and costochondritis.  Will order CXR but not think urgent need and pt may not do.  Advised to take the pred 40mg  daily x 5 day-SED.  Continue symbicort 160/4.5 2 puffs bid until all resolved.  Ventolin PRN.   Don't think she needs more abx.  F/u when due for CPX and prn  No orders of the defined types were placed in this encounter.   , MD

## 2021-12-18 NOTE — Patient Instructions (Addendum)
It was very nice to see you today!  40mg /day for 5 days.  Continue symbicort  [address] for x-ray-830-5 m-f closed for lunch 1230-1   PLEASE NOTE:  If you had any lab tests please let us know if you have not heard back within a few days. You may see your results on MyChart before we have a chance to review them but we will give you a call once they are reviewed by [place]. If we ordered any referrals today, please let us know if you have not heard from their office within the next week.   Please try these tips to maintain a healthy lifestyle:  Eat most of your calories during the day when you are active. Eliminate processed foods including packaged sweets (pies, cakes, cookies), reduce intake of potatoes, white bread, white pasta, and white rice. Look for whole grain options, oat flour or almond flour.  Each meal should contain half fruits/vegetables, one quarter protein, and one quarter carbs (no bigger than a computer mouse).  Cut down on sweet beverages. This includes juice, soda, and sweet tea. Also watch fruit intake, though this is a healthier sweet option, it still contains natural sugar! Limit to 3 servings daily.  Drink at least 1 glass of water with each meal and aim for at least 8 glasses per day  Exercise at least 150 minutes every week.

## 2021-12-27 DIAGNOSIS — E669 Obesity, unspecified: Secondary | ICD-10-CM | POA: Diagnosis not present

## 2021-12-28 DIAGNOSIS — F411 Generalized anxiety disorder: Secondary | ICD-10-CM | POA: Diagnosis not present

## 2022-01-04 DIAGNOSIS — F411 Generalized anxiety disorder: Secondary | ICD-10-CM | POA: Diagnosis not present

## 2022-01-05 IMAGING — DX DG CHEST 2V
2 series · 2 of 2 positions shown · non-contrast
Comparison: None.

CLINICAL DATA: Shortness of breath.

EXAM:
CHEST - 2 VIEW

[chest pa]
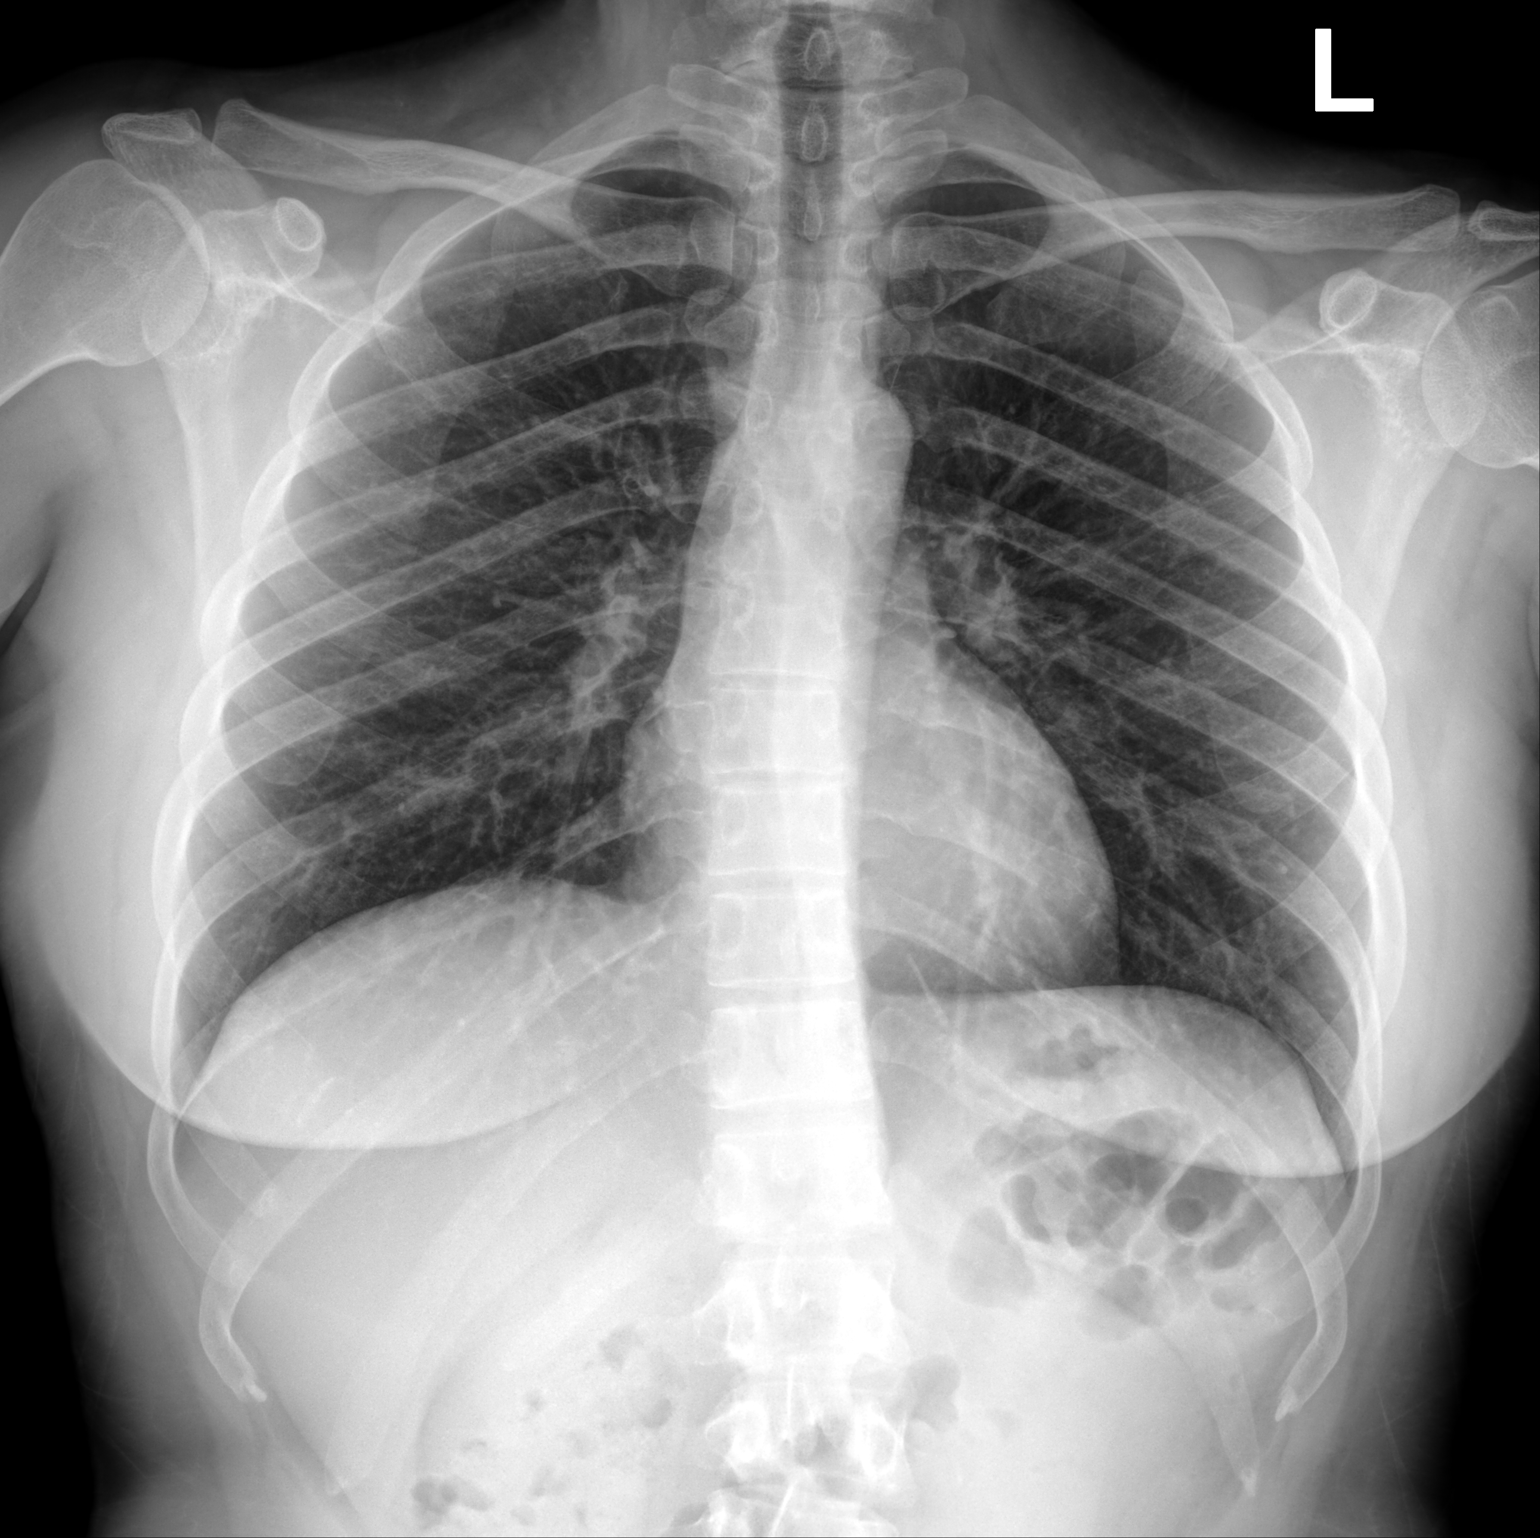

[chest lat]
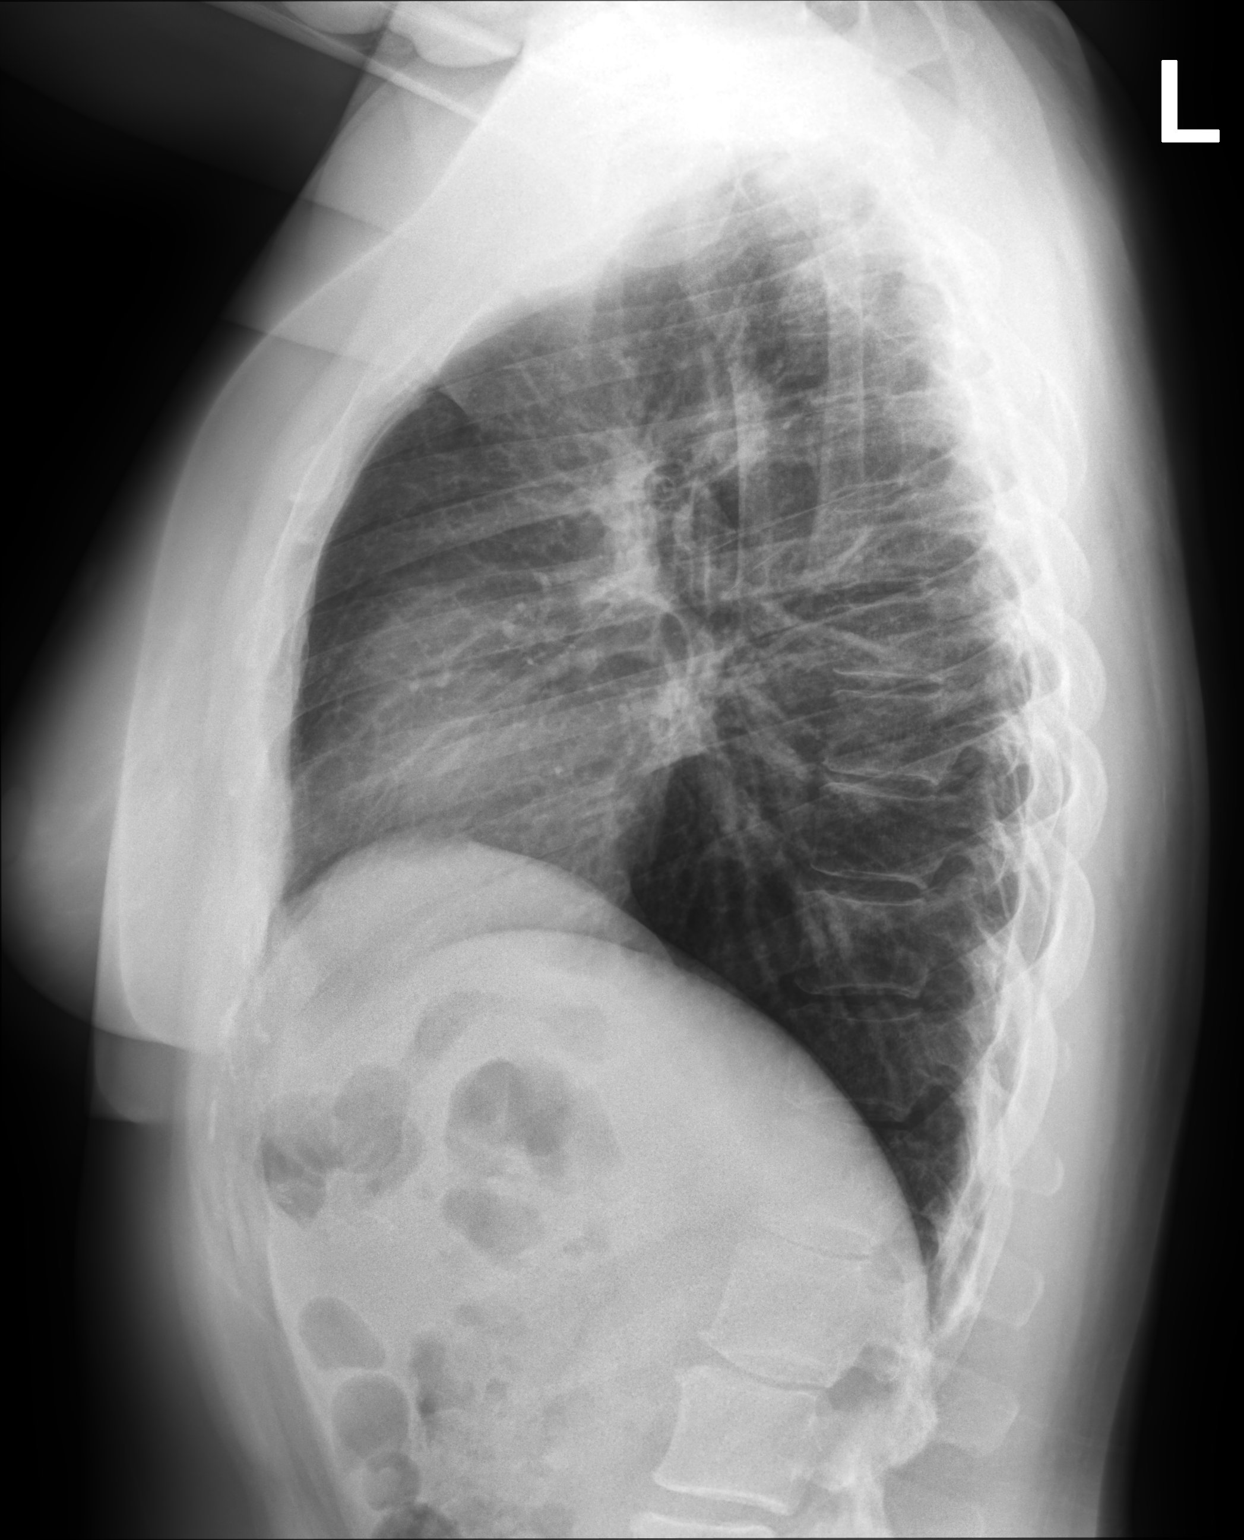

[2 of 2 positions shown; findings below may reference images not displayed]

FINDINGS: The cardiomediastinal contours are normal. The lungs are clear.
Pulmonary vasculature is normal. No consolidation, pleural effusion,
or pneumothorax. No acute osseous abnormalities are seen.
IMPRESSION: Negative radiographs of the chest. No explanation for shortness of
breath.

## 2022-01-11 ENCOUNTER — Telehealth: Payer: Self-pay | Admitting: *Deleted

## 2022-01-11 DIAGNOSIS — F411 Generalized anxiety disorder: Secondary | ICD-10-CM | POA: Diagnosis not present

## 2022-01-11 NOTE — Telephone Encounter (Signed)
Received a PA renewal for Saxenda. Medication is not on patient's current med list. Called patient to find out if she still take medication. Left message for patient to return my call.

## 2022-01-24 ENCOUNTER — Telehealth: Payer: Self-pay | Admitting: *Deleted

## 2022-01-24 NOTE — Telephone Encounter (Signed)
Spoke with patient concerning PA for Saxenda. Patient stated that she never started medication because she started Keto diet and it has worked for her. Patient stated that she still has first box that was given to her, if she decide that she want to try in the future she will discuss with Dr. Ruthine Dose. Patient stated that PA Case can be closed.   PA Reference: [id number]

## 2022-01-26 DIAGNOSIS — F411 Generalized anxiety disorder: Secondary | ICD-10-CM | POA: Diagnosis not present

## 2022-01-26 DIAGNOSIS — E669 Obesity, unspecified: Secondary | ICD-10-CM | POA: Diagnosis not present

## 2022-01-31 DIAGNOSIS — F411 Generalized anxiety disorder: Secondary | ICD-10-CM | POA: Diagnosis not present

## 2022-02-08 DIAGNOSIS — F411 Generalized anxiety disorder: Secondary | ICD-10-CM | POA: Diagnosis not present

## 2022-02-20 DIAGNOSIS — M9901 Segmental and somatic dysfunction of cervical region: Secondary | ICD-10-CM | POA: Diagnosis not present

## 2022-02-20 DIAGNOSIS — M9903 Segmental and somatic dysfunction of lumbar region: Secondary | ICD-10-CM | POA: Diagnosis not present

## 2022-02-20 DIAGNOSIS — M9902 Segmental and somatic dysfunction of thoracic region: Secondary | ICD-10-CM | POA: Diagnosis not present

## 2022-02-20 DIAGNOSIS — M9904 Segmental and somatic dysfunction of sacral region: Secondary | ICD-10-CM | POA: Diagnosis not present

## 2022-02-26 DIAGNOSIS — E669 Obesity, unspecified: Secondary | ICD-10-CM | POA: Diagnosis not present

## 2022-03-01 DIAGNOSIS — M9904 Segmental and somatic dysfunction of sacral region: Secondary | ICD-10-CM | POA: Diagnosis not present

## 2022-03-01 DIAGNOSIS — M9903 Segmental and somatic dysfunction of lumbar region: Secondary | ICD-10-CM | POA: Diagnosis not present

## 2022-03-01 DIAGNOSIS — M9902 Segmental and somatic dysfunction of thoracic region: Secondary | ICD-10-CM | POA: Diagnosis not present

## 2022-03-01 DIAGNOSIS — M9901 Segmental and somatic dysfunction of cervical region: Secondary | ICD-10-CM | POA: Diagnosis not present

## 2022-03-16 ENCOUNTER — Other Ambulatory Visit: Payer: Self-pay | Admitting: Family Medicine

## 2022-03-16 DIAGNOSIS — M9902 Segmental and somatic dysfunction of thoracic region: Secondary | ICD-10-CM | POA: Diagnosis not present

## 2022-03-16 DIAGNOSIS — M9903 Segmental and somatic dysfunction of lumbar region: Secondary | ICD-10-CM | POA: Diagnosis not present

## 2022-03-16 DIAGNOSIS — M9904 Segmental and somatic dysfunction of sacral region: Secondary | ICD-10-CM | POA: Diagnosis not present

## 2022-03-16 DIAGNOSIS — J4541 Moderate persistent asthma with (acute) exacerbation: Secondary | ICD-10-CM

## 2022-03-16 DIAGNOSIS — M9901 Segmental and somatic dysfunction of cervical region: Secondary | ICD-10-CM | POA: Diagnosis not present

## 2022-03-21 DIAGNOSIS — M9901 Segmental and somatic dysfunction of cervical region: Secondary | ICD-10-CM | POA: Diagnosis not present

## 2022-03-21 DIAGNOSIS — M9904 Segmental and somatic dysfunction of sacral region: Secondary | ICD-10-CM | POA: Diagnosis not present

## 2022-03-21 DIAGNOSIS — M9902 Segmental and somatic dysfunction of thoracic region: Secondary | ICD-10-CM | POA: Diagnosis not present

## 2022-03-21 DIAGNOSIS — M9903 Segmental and somatic dysfunction of lumbar region: Secondary | ICD-10-CM | POA: Diagnosis not present

## 2022-04-01 DIAGNOSIS — F411 Generalized anxiety disorder: Secondary | ICD-10-CM | POA: Diagnosis not present

## 2022-04-02 DIAGNOSIS — L649 Androgenic alopecia, unspecified: Secondary | ICD-10-CM | POA: Diagnosis not present

## 2022-04-05 ENCOUNTER — Ambulatory Visit (INDEPENDENT_AMBULATORY_CARE_PROVIDER_SITE_OTHER): Payer: BC Managed Care – PPO | Admitting: Family

## 2022-04-05 ENCOUNTER — Encounter: Payer: Self-pay | Admitting: Family

## 2022-04-05 VITALS — BP 125/83 | HR 76 | Temp 98.0°F | Ht 65.0 in | Wt 197.0 lb

## 2022-04-05 DIAGNOSIS — F411 Generalized anxiety disorder: Secondary | ICD-10-CM | POA: Diagnosis not present

## 2022-04-05 DIAGNOSIS — Z13228 Encounter for screening for other metabolic disorders: Secondary | ICD-10-CM | POA: Insufficient documentation

## 2022-04-05 DIAGNOSIS — Z713 Dietary counseling and surveillance: Secondary | ICD-10-CM | POA: Insufficient documentation

## 2022-04-05 DIAGNOSIS — R1902 Left upper quadrant abdominal swelling, mass and lump: Secondary | ICD-10-CM

## 2022-04-05 MED ORDER — INSULIN PEN NEEDLE 31G X 5 MM MISC: 1.0000 | Freq: Every day | 0 refills | Status: AC

## 2022-04-05 NOTE — Progress Notes (Signed)
Patient ID: Chelsea Drake, female    DOB: 03/23/1976, 46 y.o.   MRN: [id number]  Chief Complaint  Patient presents with   Mass    Pt c/o a lump on her left side, Noticed 2 days ago.  Pt states it is tender to the touch, Only painful when pushing on it.     HPI:      Skin lump:  on left side, upper abdomen, pt noticed an indent. Reports having a lot of bloating after a big meal, but no bowel movement, denies any recent abdominal exercises or vigorous workouts, no recent weight gain or loss >5 lbs. She reports mild tenderness with palpation.      Assessment & Plan:  1. Left upper quadrant abdominal mass noticeable swelling of skin below left ribs, towards epigastric area, soft, appears as pocket of fluid or soft tissue swelling. Will [place] to determine further.  - US Abdomen Limited; Future   Subjective:    Outpatient Medications Prior to Visit  Medication Sig Dispense Refill   acetaminophen (TYLENOL) 325 MG tablet Tylenol 325 mg tablet  Take 2 tablets as needed by oral route.     CALCIUM PO Take by mouth daily.     cetirizine (ZYRTEC) 5 MG tablet Take 5 mg by mouth daily.     cyclobenzaprine (FLEXERIL) 5 MG tablet Take by mouth as needed.     fluconazole (DIFLUCAN) 150 MG tablet Take 150 mg by mouth once.     Fluticasone Propionate (FLONASE NA) Place into the nose.     hydrOXYzine (ATARAX/VISTARIL) 25 MG tablet TAKE 1/2 -1 TABLETS BY MOUTH DAILY AS NEEDED FOR ANXIETY. 90 tablet 1   Montelukast Sodium (SINGULAIR PO) Take by mouth.     Multiple Vitamin (MULTIVITAMIN WITH MINERALS) TABS tablet Take 1 tablet by mouth daily.     Norethindrone-Ethinyl Estradiol-Fe Biphas (LO LOESTRIN FE) 1 MG-10 MCG / 10 MCG tablet Take by mouth.     ondansetron (ZOFRAN-ODT) 4 MG disintegrating tablet Take by mouth.     promethazine (PHENERGAN) 25 MG tablet Take 25 mg by mouth 3 (three) times daily as needed.     Spacer/Aero-Holding Chambers (AEROCHAMBER PLUS FLO-VU LARGE) MISC 1 EACH BY OTHER ROUTE  ONCE FOR 1 DOSE.     spironolactone (ALDACTONE) 100 MG tablet Take 100 mg by mouth daily.     SYMBICORT 160-4.5 MCG/ACT inhaler SMARTSIG:2 Puff(s) By Mouth Twice Daily     VENTOLIN HFA 108 (90 Base) MCG/ACT inhaler INHALE 1-2 PUFFS BY MOUTH EVERY 6 HOURS AS NEEDED FOR WHEEZE OR SHORTNESS OF BREATH 18 each 1   guaiFENesin-codeine 100-10 MG/5ML syrup Take 5 mLs by mouth 3 (three) times daily as needed for cough. (Patient not taking: Reported on 04/05/2022) 120 mL 0   No facility-administered medications prior to visit.   Past Medical History:  Diagnosis Date   Allergy    Asthma    Past Surgical History:  Procedure Laterality Date   FINGER SURGERY Right    5th digit   No Known Allergies    Objective:    Physical Exam Vitals and nursing note reviewed.  Constitutional:      Appearance: Normal appearance.  Cardiovascular:     Rate and Rhythm: Normal rate and regular rhythm.  Pulmonary:     Effort: Pulmonary effort is normal.     Breath sounds: Normal breath sounds.  Musculoskeletal:        General: Normal range of motion.  Skin:    General:  Skin is warm and dry.          Comments: notable small area of swelling - see above pic, approx. 4cm in diameter.  Neurological:     Mental Status: She is alert.  Psychiatric:        Mood and Affect: Mood normal.        Behavior: Behavior normal.    BP 125/83 (BP Location: Left Arm, Patient Position: Sitting, Cuff Size: Large)   Pulse 76   Temp 98 F (36.7 C) (Temporal)   Ht 5\' 5"  (1.651 m)   Wt 197 lb (89.4 kg)   LMP 02/23/2022 (Approximate)   SpO2 98%   BMI 32.78 kg/m  Wt Readings from Last 3 Encounters:  04/05/22 197 lb (89.4 kg)  12/18/21 193 lb (87.5 kg)  12/08/21 191 lb 6 oz (86.8 kg)       12/10/21, NP

## 2022-04-06 DIAGNOSIS — M9903 Segmental and somatic dysfunction of lumbar region: Secondary | ICD-10-CM | POA: Diagnosis not present

## 2022-04-06 DIAGNOSIS — M9904 Segmental and somatic dysfunction of sacral region: Secondary | ICD-10-CM | POA: Diagnosis not present

## 2022-04-06 DIAGNOSIS — M9901 Segmental and somatic dysfunction of cervical region: Secondary | ICD-10-CM | POA: Diagnosis not present

## 2022-04-06 DIAGNOSIS — M9902 Segmental and somatic dysfunction of thoracic region: Secondary | ICD-10-CM | POA: Diagnosis not present

## 2022-04-09 ENCOUNTER — Encounter: Payer: Self-pay | Admitting: *Deleted

## 2022-04-09 ENCOUNTER — Ambulatory Visit (HOSPITAL_BASED_OUTPATIENT_CLINIC_OR_DEPARTMENT_OTHER)
Admission: RE | Admit: 2022-04-09 | Discharge: 2022-04-09 | Disposition: A | Discharge status: Home / Self Care | Payer: BC Managed Care – PPO | Source: Ambulatory Visit | Attending: Family | Admitting: Family

## 2022-04-09 DIAGNOSIS — R1902 Left upper quadrant abdominal swelling, mass and lump: Secondary | ICD-10-CM | POA: Insufficient documentation

## 2022-04-12 NOTE — Progress Notes (Signed)
Appears that the ultrasound did not find anything of concern related to your swelling. Has it gone down any? Any worsening of swelling or pain?

## 2022-04-30 DIAGNOSIS — M9903 Segmental and somatic dysfunction of lumbar region: Secondary | ICD-10-CM | POA: Diagnosis not present

## 2022-04-30 DIAGNOSIS — M9904 Segmental and somatic dysfunction of sacral region: Secondary | ICD-10-CM | POA: Diagnosis not present

## 2022-04-30 DIAGNOSIS — M9902 Segmental and somatic dysfunction of thoracic region: Secondary | ICD-10-CM | POA: Diagnosis not present

## 2022-04-30 DIAGNOSIS — M9901 Segmental and somatic dysfunction of cervical region: Secondary | ICD-10-CM | POA: Diagnosis not present

## 2022-05-02 DIAGNOSIS — M9902 Segmental and somatic dysfunction of thoracic region: Secondary | ICD-10-CM | POA: Diagnosis not present

## 2022-05-02 DIAGNOSIS — M9901 Segmental and somatic dysfunction of cervical region: Secondary | ICD-10-CM | POA: Diagnosis not present

## 2022-05-02 DIAGNOSIS — M9904 Segmental and somatic dysfunction of sacral region: Secondary | ICD-10-CM | POA: Diagnosis not present

## 2022-05-02 DIAGNOSIS — M9903 Segmental and somatic dysfunction of lumbar region: Secondary | ICD-10-CM | POA: Diagnosis not present

## 2022-05-07 ENCOUNTER — Encounter: Payer: Self-pay | Admitting: Family Medicine

## 2022-05-07 ENCOUNTER — Ambulatory Visit (INDEPENDENT_AMBULATORY_CARE_PROVIDER_SITE_OTHER): Payer: BC Managed Care – PPO | Admitting: Family Medicine

## 2022-05-07 VITALS — BP 122/78 | HR 85 | Temp 97.9°F | Ht 65.0 in | Wt 201.2 lb

## 2022-05-07 DIAGNOSIS — M9904 Segmental and somatic dysfunction of sacral region: Secondary | ICD-10-CM | POA: Diagnosis not present

## 2022-05-07 DIAGNOSIS — J029 Acute pharyngitis, unspecified: Secondary | ICD-10-CM

## 2022-05-07 DIAGNOSIS — M9902 Segmental and somatic dysfunction of thoracic region: Secondary | ICD-10-CM | POA: Diagnosis not present

## 2022-05-07 DIAGNOSIS — J020 Streptococcal pharyngitis: Secondary | ICD-10-CM | POA: Diagnosis not present

## 2022-05-07 DIAGNOSIS — M9903 Segmental and somatic dysfunction of lumbar region: Secondary | ICD-10-CM | POA: Diagnosis not present

## 2022-05-07 DIAGNOSIS — M9901 Segmental and somatic dysfunction of cervical region: Secondary | ICD-10-CM | POA: Diagnosis not present

## 2022-05-07 LAB — POCT RAPID STREP A (OFFICE): Rapid Strep A Screen: POSITIVE — AB

## 2022-05-07 MED ORDER — FLUCONAZOLE 150 MG PO TABS: 150.0000 mg | ORAL_TABLET | Freq: Once | ORAL | 0 refills | Status: AC

## 2022-05-07 MED ORDER — AMOXICILLIN 500 MG PO CAPS: 500.0000 mg | ORAL_CAPSULE | Freq: Three times a day (TID) | ORAL | 0 refills | Status: AC

## 2022-05-07 NOTE — Progress Notes (Signed)
Subjective:     Patient ID: Chelsea Drake, female    DOB: [birth year], 46 y.o.   MRN: [id number]  Chief Complaint  Patient presents with   Sore Throat    Started last night   Sinus Problem    Sinus drainage that is causing a cough     HPI Sore throat since yesterday-had been out of town.  Last pm on plane, throat hurt and felt like had to clear throat.  Feeling achey and "blah".   Thinks strep.  This am, some congestion as well.  No fever this am.   No cough.    Health Maintenance Due  Topic Date Due   PAP SMEAR-Modifier  07/16/2021    Past Medical History:  Diagnosis Date   Allergy    Asthma     Past Surgical History:  Procedure Laterality Date   FINGER SURGERY Right    5th digit    Outpatient Medications Prior to Visit  Medication Sig Dispense Refill   acetaminophen (TYLENOL) 325 MG tablet Tylenol 325 mg tablet  Take 2 tablets as needed by oral route.     CALCIUM PO Take by mouth daily.     cetirizine (ZYRTEC) 5 MG tablet Take 5 mg by mouth daily.     cyclobenzaprine (FLEXERIL) 5 MG tablet Take by mouth as needed.     Fluticasone Propionate (FLONASE NA) Place into the nose.     hydrOXYzine (ATARAX/VISTARIL) 25 MG tablet TAKE 1/2 -1 TABLETS BY MOUTH DAILY AS NEEDED FOR ANXIETY. 90 tablet 1   Insulin Pen Needle 31G X 5 MM MISC Inject 1 each into the skin daily. 100 each 0   Liraglutide -Weight Management 18 MG/3ML SOPN Inject 0.6 mg into the skin daily.     Montelukast Sodium (SINGULAIR PO) Take by mouth.     Multiple Vitamin (MULTIVITAMIN WITH MINERALS) TABS tablet Take 1 tablet by mouth daily.     Norethindrone-Ethinyl Estradiol-Fe Biphas (LO LOESTRIN FE) 1 MG-10 MCG / 10 MCG tablet Take by mouth.     ondansetron (ZOFRAN-ODT) 4 MG disintegrating tablet Take by mouth.     promethazine (PHENERGAN) 25 MG tablet Take 25 mg by mouth 3 (three) times daily as needed.     Spacer/Aero-Holding Chambers (AEROCHAMBER PLUS FLO-VU LARGE) MISC 1 EACH BY OTHER ROUTE ONCE FOR  1 DOSE.     spironolactone (ALDACTONE) 100 MG tablet Take 100 mg by mouth daily.     SYMBICORT 160-4.5 MCG/ACT inhaler SMARTSIG:2 Puff(s) By Mouth Twice Daily     VENTOLIN HFA 108 (90 Base) MCG/ACT inhaler INHALE 1-2 PUFFS BY MOUTH EVERY 6 HOURS AS NEEDED FOR WHEEZE OR SHORTNESS OF BREATH 18 each 1   fluconazole (DIFLUCAN) 150 MG tablet Take 150 mg by mouth once. (Patient not taking: Reported on 05/07/2022)     No facility-administered medications prior to visit.    No Known Allergies ROS neg/noncontributory except as noted HPI/below      Objective:     BP 122/78   Pulse 85   Temp 97.9 F (36.6 C) (Temporal)   Ht 5\' 5"  (1.651 m)   Wt 201 lb 4 oz (91.3 kg)   SpO2 97%   BMI 33.49 kg/m  Wt Readings from Last 3 Encounters:  05/07/22 201 lb 4 oz (91.3 kg)  04/05/22 197 lb (89.4 kg)  12/18/21 193 lb (87.5 kg)    Physical Exam   Gen: WDWN NAD HEENT: NCAT, conjunctiva not injected, sclera nonicteric TM WNL B, OP  moist, no exudates  NECK:  supple, no thyromegaly, no nodes, no carotid bruits CARDIAC: RRR, S1S2+, no murmur. LUNGS: CTAB. No wheezes EXT:  no edema MSK: no gross abnormalities.  NEURO: A&O x3.  CN II-XII intact.  PSYCH: normal mood. Good eye contact  Strep +  Results for orders placed or performed in visit on 05/07/22  POCT rapid strep A  Result Value Ref Range   Rapid Strep A Screen Positive (A) Negative        Assessment & Plan:   Problem List Items Addressed This Visit   None Visit Diagnoses     Strep throat    -  Primary   Relevant Medications   fluconazole (DIFLUCAN) 150 MG tablet   Sore throat       Relevant Orders   POCT rapid strep A (Completed)      Strep throat-amox 500tid x 10d.  Difucan if yeast  Meds ordered this encounter  Medications   amoxicillin (AMOXIL) 500 MG capsule    Sig: Take 1 capsule (500 mg total) by mouth 3 (three) times daily for 10 days.    Dispense:  30 capsule    Refill:  0   fluconazole (DIFLUCAN) 150 MG  tablet    Sig: Take 1 tablet (150 mg total) by mouth once for 1 dose.    Dispense:  1 tablet    Refill:  0    Wellington Hampshire, MD

## 2022-05-29 DIAGNOSIS — M9903 Segmental and somatic dysfunction of lumbar region: Secondary | ICD-10-CM | POA: Diagnosis not present

## 2022-05-29 DIAGNOSIS — M9902 Segmental and somatic dysfunction of thoracic region: Secondary | ICD-10-CM | POA: Diagnosis not present

## 2022-05-29 DIAGNOSIS — M9904 Segmental and somatic dysfunction of sacral region: Secondary | ICD-10-CM | POA: Diagnosis not present

## 2022-05-29 DIAGNOSIS — M9901 Segmental and somatic dysfunction of cervical region: Secondary | ICD-10-CM | POA: Diagnosis not present

## 2022-06-10 ENCOUNTER — Ambulatory Visit
Admission: EM | Admit: 2022-06-10 | Discharge: 2022-06-10 | Disposition: A | Discharge status: Home / Self Care | Payer: BC Managed Care – PPO | Attending: Urgent Care | Admitting: Urgent Care

## 2022-06-10 DIAGNOSIS — J45909 Unspecified asthma, uncomplicated: Secondary | ICD-10-CM

## 2022-06-10 DIAGNOSIS — J069 Acute upper respiratory infection, unspecified: Secondary | ICD-10-CM | POA: Diagnosis not present

## 2022-06-10 DIAGNOSIS — R070 Pain in throat: Secondary | ICD-10-CM

## 2022-06-10 DIAGNOSIS — J309 Allergic rhinitis, unspecified: Secondary | ICD-10-CM | POA: Diagnosis not present

## 2022-06-10 LAB — POCT RAPID STREP A (OFFICE): Rapid Strep A Screen: NEGATIVE

## 2022-06-10 MED ORDER — PSEUDOEPHEDRINE HCL 60 MG PO TABS: 60.0000 mg | ORAL_TABLET | Freq: Three times a day (TID) | ORAL | 0 refills | Status: DC | PRN

## 2022-06-10 NOTE — ED Triage Notes (Signed)
Pt c/o sore throat started last night-reports +strep ~1 month ago-completed abx-NAD-steady gait

## 2022-06-10 NOTE — ED Provider Notes (Signed)
[facility]  Note:  This document was prepared using Conservation officer, historic buildings and may include unintentional dictation errors.  MRN: [id number] DOB: 1976-01-21  Subjective:   Chelsea Drake is a 46 y.o. female presenting for 1 day history of acute onset recurrent throat pain, painful swallowing.  Patient has also had left ear pain, coughing.  Had strep throat 1 month ago, finished amoxicillin.  Has a history of allergies and asthma.  Takes medications consistently for this.  No current facility-administered medications for this encounter.  Current Outpatient Medications:    acetaminophen (TYLENOL) 325 MG tablet, Tylenol 325 mg tablet  Take 2 tablets as needed by oral route., Disp: , Rfl:    CALCIUM PO, Take by mouth daily., Disp: , Rfl:    cetirizine (ZYRTEC) 5 MG tablet, Take 5 mg by mouth daily., Disp: , Rfl:    cyclobenzaprine (FLEXERIL) 5 MG tablet, Take by mouth as needed., Disp: , Rfl:    fluconazole (DIFLUCAN) 150 MG tablet, Take 150 mg by mouth once. (Patient not taking: Reported on 05/07/2022), Disp: , Rfl:    Fluticasone Propionate (FLONASE NA), Place into the nose., Disp: , Rfl:    hydrOXYzine (ATARAX/VISTARIL) 25 MG tablet, TAKE 1/2 -1 TABLETS BY MOUTH DAILY AS NEEDED FOR ANXIETY., Disp: 90 tablet, Rfl: 1   Insulin Pen Needle 31G X 5 MM MISC, Inject 1 each into the skin daily., Disp: 100 each, Rfl: 0   Liraglutide -Weight Management 18 MG/3ML SOPN, Inject 0.6 mg into the skin daily., Disp: , Rfl:    Montelukast Sodium (SINGULAIR PO), Take by mouth., Disp: , Rfl:    Multiple Vitamin (MULTIVITAMIN WITH MINERALS) TABS tablet, Take 1 tablet by mouth daily., Disp: , Rfl:    Norethindrone-Ethinyl Estradiol-Fe Biphas (LO LOESTRIN FE) 1 MG-10 MCG / 10 MCG tablet, Take by mouth., Disp: , Rfl:    ondansetron (ZOFRAN-ODT) 4 MG disintegrating tablet, Take by mouth., Disp: , Rfl:    promethazine (PHENERGAN) 25 MG tablet, Take 25 mg by mouth 3 (three)  times daily as needed., Disp: , Rfl:    Spacer/Aero-Holding Chambers (AEROCHAMBER PLUS FLO-VU LARGE) MISC, 1 EACH BY OTHER ROUTE ONCE FOR 1 DOSE., Disp: , Rfl:    spironolactone (ALDACTONE) 100 MG tablet, Take 100 mg by mouth daily., Disp: , Rfl:    SYMBICORT 160-4.5 MCG/ACT inhaler, SMARTSIG:2 Puff(s) By Mouth Twice Daily, Disp: , Rfl:    VENTOLIN HFA 108 (90 Base) MCG/ACT inhaler, INHALE 1-2 PUFFS BY MOUTH EVERY 6 HOURS AS NEEDED FOR WHEEZE OR SHORTNESS OF BREATH, Disp: 18 each, Rfl: 1   No Known Allergies  Past Medical History:  Diagnosis Date   Allergy    Asthma      Past Surgical History:  Procedure Laterality Date   FINGER SURGERY Right    5th digit    Family History  Problem Relation Age of Onset   Asthma Father    Hypertrophic cardiomyopathy Father     Social History   Tobacco Use   Smoking status: Never   Smokeless tobacco: Never  Vaping Use   Vaping Use: Never used  Substance Use Topics   Alcohol use: Yes    Comment: occ   Drug use: Yes    Types: Marijuana    ROS   Objective:   Vitals: BP (!) 129/92 (BP Location: Left Arm)   Pulse 88   Temp 98.4 F (36.9 C) (Oral)   Resp 20   SpO2 97%  Physical Exam Constitutional:      General: She is not in acute distress.    Appearance: Normal appearance. She is well-developed and normal weight. She is not ill-appearing, toxic-appearing or diaphoretic.  HENT:     Head: Normocephalic and atraumatic.     Right Ear: Tympanic membrane, ear canal and external ear normal. No drainage or tenderness. No middle ear effusion. There is no impacted cerumen. Tympanic membrane is not erythematous or bulging.     Left Ear: Tympanic membrane, ear canal and external ear normal. No drainage or tenderness.  No middle ear effusion. There is no impacted cerumen. Tympanic membrane is not erythematous or bulging.     Nose: Nose normal. No congestion or rhinorrhea.     Mouth/Throat:     Mouth: Mucous membranes are moist. No oral  lesions.     Pharynx: No pharyngeal swelling, oropharyngeal exudate, posterior oropharyngeal erythema or uvula swelling.     Tonsils: No tonsillar exudate or tonsillar abscesses.     Comments: Cobblestone pattern postnasal drainage overlying pharynx. Eyes:     General: No scleral icterus.       Right eye: No discharge.        Left eye: No discharge.     Extraocular Movements: Extraocular movements intact.     Right eye: Normal extraocular motion.     Left eye: Normal extraocular motion.     Conjunctiva/sclera: Conjunctivae normal.  Cardiovascular:     Rate and Rhythm: Normal rate and regular rhythm.     Heart sounds: Normal heart sounds. No murmur heard.    No friction rub. No gallop.  Pulmonary:     Effort: Pulmonary effort is normal. No respiratory distress.     Breath sounds: No stridor. No wheezing, rhonchi or rales.  Chest:     Chest wall: No tenderness.  Musculoskeletal:     Cervical back: Normal range of motion and neck supple.  Lymphadenopathy:     Cervical: No cervical adenopathy.  Skin:    General: Skin is warm and dry.  Neurological:     General: No focal deficit present.     Mental Status: She is alert and oriented to person, place, and time.  Psychiatric:        Mood and Affect: Mood normal.        Behavior: Behavior normal.     Results for orders placed or performed during the hospital encounter of 06/10/22 (from the past 24 hour(s))  POCT rapid strep A     Status: None   Collection Time: 06/10/22  3:53 PM  Result Value Ref Range   Rapid Strep A Screen Negative Negative    Assessment and Plan :   PDMP not reviewed this encounter.  1. Viral upper respiratory illness   2. Throat pain   3. Allergic rhinitis, unspecified seasonality, unspecified trigger   4. Moderate asthma without complication, unspecified whether persistent     Strep culture pending.  Recommend supportive care for an acute viral URI, viral pharyngitis. Counseled patient on potential  for adverse effects with medications prescribed/recommended today, ER and return-to-clinic precautions discussed, patient verbalized understanding.    Wallis Bamberg, [place] 06/11/22 [phone]

## 2022-06-12 ENCOUNTER — Other Ambulatory Visit: Payer: Self-pay | Admitting: Family Medicine

## 2022-06-12 ENCOUNTER — Telehealth: Payer: Self-pay | Admitting: Family Medicine

## 2022-06-12 MED ORDER — MONTELUKAST SODIUM 10 MG PO TABS: 10.0000 mg | ORAL_TABLET | Freq: Every day | ORAL | 3 refills | Status: DC

## 2022-06-12 NOTE — Telephone Encounter (Signed)
Please advise 

## 2022-06-12 NOTE — Telephone Encounter (Signed)
Pt states: -Works for [employer] and was able to get Rx through office clinic. -Clinic is no longer available.  -Has doses left -Has spoken with PCP about this medication before.   Pt asks: -Can PCP take over Rx or does she need a visit.     Montelukast Sodium (SINGULAIR PO) [[id number]] 10mg  tabs once a day.   [id number] - , [place] - [address] [place] Phone: [phone]  Fax: [phone] DEA #: [phone]

## 2022-06-13 NOTE — Telephone Encounter (Signed)
Noted  

## 2022-06-14 LAB — CULTURE, GROUP A STREP (THRC)

## 2022-06-15 ENCOUNTER — Encounter: Payer: Self-pay | Admitting: Internal Medicine

## 2022-06-15 ENCOUNTER — Telehealth: Payer: Self-pay | Admitting: Family Medicine

## 2022-06-15 ENCOUNTER — Ambulatory Visit: Payer: BC Managed Care – PPO | Admitting: Internal Medicine

## 2022-06-15 VITALS — BP 135/97 | HR 89 | Temp 97.8°F | Resp 12 | Ht 65.0 in | Wt 202.0 lb

## 2022-06-15 DIAGNOSIS — J320 Chronic maxillary sinusitis: Secondary | ICD-10-CM | POA: Diagnosis not present

## 2022-06-15 MED ORDER — IPRATROPIUM BROMIDE 0.06 % NA SOLN: 2.0000 | Freq: Four times a day (QID) | NASAL | 0 refills | Status: DC

## 2022-06-15 MED ORDER — AMOXICILLIN-POT CLAVULANATE 875-125 MG PO TABS: 1.0000 | ORAL_TABLET | Freq: Two times a day (BID) | ORAL | 0 refills | Status: DC

## 2022-06-15 MED ORDER — GUAIFENESIN-CODEINE 100-10 MG/5ML PO SOLN: 5.0000 mL | Freq: Three times a day (TID) | ORAL | 0 refills | Status: DC | PRN

## 2022-06-15 NOTE — Patient Instructions (Addendum)
Sinusitis and associated upper respiratory infections: I recommend rest mainly  I recommend reduce risk of spreading the infection by cough.   My recommended self-care regimen is Robitussin cough and cold complete, 2-10x daily nasal sinus rinses with SimplySaline, Flonase (or similar) after the sinus rinses, Sudafed for nasal congestion that persists despite all of the above and also Zyrtec can help some even though this is not an allergy problem with keeping congestion down, which reduces post nasal dripping.  Reduced post nasal drip can help with cough and sore throat.  Chicken noodle soup, popsicles, and lozenges are also very soothing to the throat.   A sinus infection is soreness and swelling (inflammation) of your sinuses. Sinuses are hollow spaces in the bones around your face. They are located: Around your eyes. In the middle of your forehead. Behind your nose. In your cheekbones. Your sinuses and nasal passages are lined with a fluid called mucus. Mucus drains out of your sinuses. Swelling can trap mucus in your sinuses. This lets germs (bacteria, virus, or fungus) grow, which leads to infection. Most of the time, this condition is caused by a virus.  Chronic allergies can increase swelling in the passages of the sinuses, reducing drainage of mucous and causing infections. What are the causes? Allergies. Asthma. Germs. Things that block your nose or sinuses. Growths in the nose (nasal polyps). Chemicals or irritants in the air. A fungus. This is rare. What increases the risk? Having a weak body defense system (immune system). Doing a lot of swimming or diving. Using nasal sprays too much. Smoking. What are the signs or symptoms? The main symptoms of this condition are pain and a feeling of pressure around the sinuses. Other symptoms include: Stuffy nose (congestion). This may make it hard to breathe through your nose. Runny nose (drainage). Soreness, swelling, and warmth in the  sinuses. A cough that may get worse at night. Being unable to smell and taste. Mucus that collects in the throat or the back of the nose (postnasal drip). This may cause a sore throat or bad breath. Being very tired (fatigued). A fever. How is this diagnosed? Your symptoms. Your medical history. A physical exam. Tests to find out if your condition is short-term (acute) or long-term (chronic). Your doctor may: Check your nose for growths (polyps). Check your sinuses using a tool that has a light on one end (endoscope). Check for allergies or germs. Do imaging tests, such as an MRI or CT scan. How is this treated? Treatment for this condition depends on the cause and whether it is short-term or long-term. If caused by a virus, your symptoms should go away on their own within 10 days. You may be given medicines to relieve symptoms. They include: Medicines that shrink swollen tissue in the nose. A spray that treats swelling of the nostrils. Rinses that help get rid of thick mucus in your nose (nasal saline washes). Medicines that treat allergies (antihistamines). Over-the-counter pain relievers. If caused by bacteria, your doctor may wait to see if you will get better without treatment. You may be given antibiotic medicine if you have: A very bad infection. A weak body defense system. If caused by growths in the nose, surgery may be needed. Follow these instructions at home: Medicines Take, use, or apply over-the-counter and prescription medicines only as told by your doctor. These may include nasal sprays. If you were prescribed an antibiotic medicine, take it as told by your doctor. Do not stop taking it even  if you start to feel better. Hydrate and humidify  Drink enough water to keep your pee (urine) pale yellow. Use a cool mist humidifier to keep the humidity level in your home above 50%. Breathe in steam for 10-15 minutes, 3-4 times a day, or as told by your doctor. You can do  this in the bathroom while a hot shower is running. Try not to spend time in cool or dry air. Decongest and rinse out your sinuses with daily SimplySaline gentle misting spray- apply any medicated sinus sprays after using this misting to blow your nose. Rest Rest as much as you can. Sleep with your head raised (elevated). Make sure you get enough sleep each night. General instructions  Put a warm, moist washcloth on your face 3-4 times a day, or as often as told by your doctor. Use nasal saline washes as often as told by your doctor. Wash your hands often with soap and water. If you cannot use soap and water, use hand sanitizer. Do not smoke. Avoid being around people who are smoking (secondhand smoke). Keep all follow-up visits. Contact a doctor if: You have a fever. Your symptoms get worse. Your symptoms do not get better within 10 days. Get help right away if: You have a very bad headache. You cannot stop vomiting. You have very bad pain or swelling around your face or eyes. You have trouble seeing. You feel confused. Your neck is stiff. You have trouble breathing. These symptoms may be an emergency. Get help right away. Call 911. Do not wait to see if the symptoms will go away. Do not drive yourself to the hospital. Summary A sinus infection is swelling of your sinuses. Sinuses are hollow spaces in the bones around your face. This condition is caused by tissues in your nose that become inflamed or swollen. This traps germs. These can lead to infection. Daily sinus rinsing can reduce your risk for these. If you were prescribed an antibiotic medicine, take it as told by your doctor. Do not stop taking it even if you start to feel better. Keep all follow-up visits.

## 2022-06-15 NOTE — Telephone Encounter (Signed)
Patient seen by Dr. Jon Billings on 06/15/2022.

## 2022-06-15 NOTE — Telephone Encounter (Signed)
Pt was advised to go to ED   Patient Name: Chelsea Drake Gender: Female DOB: 1975-12-29 Age: 46 Y 19 D Return Phone Number: [phone] (Primary) Address: City/ State/ Zip: [place]  [address] Client [facility] - Human resources officer [facility] Type Call Who Is Calling Patient / Member / Family / Caregiver Call Type Triage / Clinical Relationship To Patient Self Return Phone Number [phone] (Primary) Chief Complaint WHEEZING Reason for Call Symptomatic / Request for Health Information Initial Comment Caller states she is feeling crappy, she has a bad cough. Caller states she thought it was just a cold but it is taking a turning for the worse. Caller states she would like to be seen by someone today. Calling states she is wheezing and having to use her inhaler. Translation No Nurse Assessment Nurse: Humfleet, RN, Marchelle Folks Date/Time (Eastern Time): 06/15/2022 8:09:01 AM Confirm and document reason for call. If symptomatic, describe symptoms. ---caller states she does not feel good. last night got worse and took cough medicine and was up all night coughing. has had cough all week. started using rescue inhaler several times per day. Does the patient have any new or worsening symptoms? ---Yes Will a triage be completed? ---Yes Related visit to physician within the last 2 weeks? ---No Does the PT have any chronic conditions? (i.e. diabetes, asthma, this includes High risk factors for pregnancy, etc.) ---Yes List chronic conditions. ---asthma Is the patient pregnant or possibly pregnant? (Ask all females between the ages of 43-55) ---No Is this a behavioral health or substance abuse call? ---No Guidelines Guideline Title Affirmed Question Affirmed Notes Nurse Date/Time (Eastern Time) Asthma Attack Quick-relief asthma medicine (e.g., albuterol Jacques Navy, Humfleet,  RN, Marchelle Folks 06/15/2022 8:10:57 AM Guidelines Guideline Title Affirmed Question Affirmed Notes Nurse Date/Time (Eastern Time) levalbuterol by inhaler or nebulizer) is needed more frequently than every 4 hours to keep you comfortable Disp. Time Lamount Cohen Time) Disposition Final User 06/15/2022 8:08:00 AM Send to Urgent Marrian Salvage, [place] 06/15/2022 8:14:56 AM Go to ED Now (or PCP triage) Yes Humfleet, RN, Marchelle Folks Final Disposition 06/15/2022 8:14:56 AM Go to ED Now (or PCP triage) Yes Humfleet, RN, Earnestine Leys Disagree/Comply Comply Caller Understands Yes PreDisposition Did not know what to do Care Advice Given Per Guideline GO TO ED NOW (OR PCP TRIAGE): CARE ADVICE given per Asthma Attack (Adult) guideline. * IF NO PCP (PRIMARY CARE PROVIDER) SECOND-LEVEL TRIAGE: You need to be seen within the next hour. Go to the ED/UCC at _____________ Hospital. Leave as soon as you can. Comments User: Jaclynn Major, RN Date/Time Lamount Cohen Time): 06/15/2022 8:14:52 AM used albuterol every 2 hours thru the night. does not want to go to UC. asking for appointment Referrals REFERRED TO PCP OFFICE

## 2022-06-15 NOTE — Progress Notes (Signed)
Acute Office Visit  Subjective:     Patient ID: Chelsea Drake, female    DOB: May 30, 1976, 46 y.o.   MRN: [id number]  Chief Complaint  Patient presents with   Severe cough    Persistent mostly at night, Took cough syrup with codeine last night which caused her to be up every hour or two..Took Tylenol Mucinex, Sudafed with some relief but still lingering. Got worse last night. Producing a light greenish mucus (small amount) yesterday morning.   Sinus pressure    Took two different nasal sprays (Flonase and another one).   Nasal Congestion    Feels better today, blowing nose produces clear mucus.    HPI Patient is in today for illness all week Already got checked for strep throat- negative  Feels like a cold all week Lots of sinus congestion all week Asthma ok except  at night wakes her up and bothers her- has tried to manage with robitussin Had cough syrup with codeine leftover and took it and let her sleep- when she woke up had cough and congestion but felt better over the course of the day Ball of phlegm in back of throat came and went Last night it got real bad felt miserable- took the cough syrup with codeine again but last night didn't help much Her dad is physician Trying altg astepro and benadryl She doesn't know if waking up due to nasal congestion or what During day coughing but its manageable. Feels like she probably has some kind of virus. Also here partly due to husband wanted her to come but doesn't think anything we can do.  She feels exhausted today due to not sleeping well at all last night Can't tell if she has worsening of 7 days sinus infection vs new viral infection.   ROS      Objective:    BP (!) 135/97 (BP Location: Right Arm, Patient Position: Sitting)   Pulse 89   Temp 97.8 F (36.6 C) (Temporal)   Resp 12   Ht 5\' 5"  (1.651 m)   Wt 202 lb (91.6 kg)   SpO2 99%   BMI 33.61 kg/m  Elevated blood pressure noted  Physical  Exam Constitutional:      General: She is not in acute distress.    Appearance: Normal appearance. She is not ill-appearing, toxic-appearing or diaphoretic.  HENT:     Head: Normocephalic and atraumatic.     Nose: Nose normal.  Eyes:     General: No scleral icterus.    Conjunctiva/sclera: Conjunctivae normal.  Pulmonary:     Effort: Pulmonary effort is normal. No respiratory distress.  Skin:    Coloration: Skin is not jaundiced.  Neurological:     General: No focal deficit present.     Mental Status: She is alert.  Psychiatric:        Mood and Affect: Mood normal.        Behavior: Behavior normal.        Thought Content: Thought content normal.        Judgment: Judgment normal.    Sinus congestion erythematous sinus, no cervical lymphadenopathy    Assessment & Plan:   Hard to say bacterial vs viral- 7 days duration now- will give rescue antibiotic(s) if persists she requested something like afrin so she can sleep.     Problem List Items Addressed This Visit   None Visit Diagnoses     Chronic maxillary sinusitis    -  Primary  Relevant Medications   ipratropium (ATROVENT) 0.06 % nasal spray   amoxicillin-clavulanate (AUGMENTIN) 875-125 MG tablet   guaiFENesin-codeine 100-10 MG/5ML syrup       Meds ordered this encounter  Medications   ipratropium (ATROVENT) 0.06 % nasal spray    Sig: Place 2 sprays into both nostrils 4 (four) times daily.    Dispense:  15 mL    Refill:  0   amoxicillin-clavulanate (AUGMENTIN) 875-125 MG tablet    Sig: Take 1 tablet by mouth 2 (two) times daily.    Dispense:  20 tablet    Refill:  0   guaiFENesin-codeine 100-10 MG/5ML syrup    Sig: Take 5 mLs by mouth 3 (three) times daily as needed for cough.    Dispense:  120 mL    Refill:  0   Lula Olszewski, MD

## 2022-06-19 ENCOUNTER — Other Ambulatory Visit: Payer: Self-pay | Admitting: Family Medicine

## 2022-06-19 DIAGNOSIS — J4541 Moderate persistent asthma with (acute) exacerbation: Secondary | ICD-10-CM

## 2022-06-21 DIAGNOSIS — M9901 Segmental and somatic dysfunction of cervical region: Secondary | ICD-10-CM | POA: Diagnosis not present

## 2022-06-21 DIAGNOSIS — M9904 Segmental and somatic dysfunction of sacral region: Secondary | ICD-10-CM | POA: Diagnosis not present

## 2022-06-21 DIAGNOSIS — M9902 Segmental and somatic dysfunction of thoracic region: Secondary | ICD-10-CM | POA: Diagnosis not present

## 2022-06-21 DIAGNOSIS — M9903 Segmental and somatic dysfunction of lumbar region: Secondary | ICD-10-CM | POA: Diagnosis not present

## 2022-10-01 ENCOUNTER — Other Ambulatory Visit (HOSPITAL_BASED_OUTPATIENT_CLINIC_OR_DEPARTMENT_OTHER): Payer: Self-pay

## 2022-10-01 MED ORDER — SAXENDA 18 MG/3ML ~~LOC~~ SOPN
2.4000 mg | PEN_INJECTOR | Freq: Every day | SUBCUTANEOUS | 2 refills | Status: DC
  Filled 2022-10-01: qty 15, 30d supply, fill #0
  Filled 2022-10-30: qty 15, 30d supply, fill #1
  Filled 2022-12-05: qty 15, 30d supply, fill #2

## 2022-10-30 ENCOUNTER — Other Ambulatory Visit (HOSPITAL_BASED_OUTPATIENT_CLINIC_OR_DEPARTMENT_OTHER): Payer: Self-pay

## 2022-11-13 LAB — HM MAMMOGRAPHY

## 2022-11-29 ENCOUNTER — Other Ambulatory Visit: Payer: Self-pay | Admitting: Family Medicine

## 2022-11-29 DIAGNOSIS — J4541 Moderate persistent asthma with (acute) exacerbation: Secondary | ICD-10-CM

## 2022-11-30 ENCOUNTER — Other Ambulatory Visit: Payer: Self-pay | Admitting: Family Medicine

## 2022-11-30 DIAGNOSIS — J4541 Moderate persistent asthma with (acute) exacerbation: Secondary | ICD-10-CM

## 2022-11-30 MED ORDER — ALBUTEROL SULFATE HFA 108 (90 BASE) MCG/ACT IN AERS: 2.0000 | INHALATION_SPRAY | RESPIRATORY_TRACT | 0 refills | Status: DC | PRN

## 2022-11-30 NOTE — Telephone Encounter (Signed)
Patient is OK with albuterol sent in, f/u with PCP scheduled

## 2022-11-30 NOTE — Telephone Encounter (Signed)
Please advise 

## 2022-12-05 ENCOUNTER — Other Ambulatory Visit (HOSPITAL_BASED_OUTPATIENT_CLINIC_OR_DEPARTMENT_OTHER): Payer: Self-pay

## 2022-12-11 LAB — HM COLONOSCOPY

## 2022-12-13 ENCOUNTER — Encounter: Payer: Self-pay | Admitting: Family Medicine

## 2022-12-21 ENCOUNTER — Ambulatory Visit: Payer: No Typology Code available for payment source | Admitting: Family Medicine

## 2022-12-21 ENCOUNTER — Encounter: Payer: Self-pay | Admitting: Family Medicine

## 2022-12-21 VITALS — BP 126/89 | HR 70 | Temp 97.3°F | Ht 65.0 in | Wt 181.8 lb

## 2022-12-21 DIAGNOSIS — R110 Nausea: Secondary | ICD-10-CM

## 2022-12-21 DIAGNOSIS — J4520 Mild intermittent asthma, uncomplicated: Secondary | ICD-10-CM | POA: Diagnosis not present

## 2022-12-21 MED ORDER — ONDANSETRON 4 MG PO TBDP: 4.0000 mg | ORAL_TABLET | Freq: Three times a day (TID) | ORAL | 1 refills | Status: DC | PRN

## 2022-12-21 NOTE — Progress Notes (Unsigned)
Subjective:     Patient ID: Chelsea Drake, female    DOB: 1975/07/19, 47 y.o.   MRN: [id number]  Chief Complaint  Patient presents with   Asthma follow-up    HPI  Asthma-doing well.  No medications.  Using albuterol <once/week(s). Does take singulair daily.  Does keep albuterol in purse, etc.  Thinks ventolin does better than proair-but insurance won't cover.  2.  Weight-on saxenda since Feb.  Doing well.  Lost 23#.  Work is hard so not as much exercise.  Mild nausea and occasional heartburn. Gets from gynecology-[facility]  Health Maintenance Due  Topic Date Due   HIV Screening  Never done   Hepatitis C Screening  Never done   PAP SMEAR-Modifier  07/16/2021   COVID-19 Vaccine (5 - 2023-24 season) 03/16/2022   MAMMOGRAM  09/19/2022    Past Medical History:  Diagnosis Date   Allergy    Asthma     Past Surgical History:  Procedure Laterality Date   FINGER SURGERY Right    5th digit     Current Outpatient Medications:    albuterol (VENTOLIN HFA) 108 (90 Base) MCG/ACT inhaler, Inhale 2 puffs into the lungs every 4 (four) hours as needed for wheezing or shortness of breath., Disp: 8.5 each, Rfl: 0   budesonide-formoterol (SYMBICORT) 80-4.5 MCG/ACT inhaler, TAKE 2 PUFFS BY MOUTH TWICE A DAY, Disp: , Rfl:    CALCIUM PO, Take by mouth daily., Disp: , Rfl:    cetirizine (ZYRTEC) 10 MG tablet, Take by mouth., Disp: , Rfl:    Fluticasone Propionate (FLONASE NA), Place into the nose., Disp: , Rfl:    hydrOXYzine (ATARAX/VISTARIL) 25 MG tablet, TAKE 1/2 -1 TABLETS BY MOUTH DAILY AS NEEDED FOR ANXIETY., Disp: 90 tablet, Rfl: 1   Insulin Pen Needle 31G X 5 MM MISC, Inject 1 each into the skin daily., Disp: 100 each, Rfl: 0   Ketotifen Fumarate (ALAWAY OP), Apply to eye daily., Disp: , Rfl:    Liraglutide -Weight Management (SAXENDA) 18 MG/3ML SOPN, Inject 2.4 mg into the skin daily as directed for week 4,then week 5 & thereafter inject 3 mg daily as directed, Disp: 15 mL, Rfl:  2   Liraglutide -Weight Management 18 MG/3ML SOPN, Inject 0.6 mg into the skin daily., Disp: , Rfl:    montelukast (SINGULAIR) 10 MG tablet, Take 1 tablet (10 mg total) by mouth at bedtime., Disp: 90 tablet, Rfl: 3   Multiple Vitamin (MULTIVITAMIN WITH MINERALS) TABS tablet, Take 1 tablet by mouth daily., Disp: , Rfl:    Norethindrone-Ethinyl Estradiol-Fe Biphas (LO LOESTRIN FE) 1 MG-10 MCG / 10 MCG tablet, Take by mouth., Disp: , Rfl:    ondansetron (ZOFRAN-ODT) 4 MG disintegrating tablet, Take by mouth., Disp: , Rfl:    pseudoephedrine (SUDAFED) 60 MG tablet, Take 1 tablet (60 mg total) by mouth every 8 (eight) hours as needed for congestion., Disp: 30 tablet, Rfl: 0   spironolactone (ALDACTONE) 100 MG tablet, Take 100 mg by mouth daily., Disp: , Rfl:    cetirizine (ZYRTEC) 5 MG tablet, Take 5 mg by mouth daily. (Patient not taking: Reported on 12/21/2022), Disp: , Rfl:    Spacer/Aero-Holding Chambers (AEROCHAMBER PLUS FLO-VU LARGE) MISC, 1 EACH BY OTHER ROUTE ONCE FOR 1 DOSE. (Patient not taking: Reported on 12/21/2022), Disp: , Rfl:    SYMBICORT 160-4.5 MCG/ACT inhaler, SMARTSIG:2 Puff(s) By Mouth Twice Daily (Patient not taking: Reported on 12/21/2022), Disp: , Rfl:   No Known Allergies ROS neg/noncontributory except as  noted HPI/below      Objective:     BP 126/89 (BP Location: Right Arm, Patient Position: Sitting)   Pulse 70   Temp (!) 97.3 F (36.3 C) (Temporal)   Ht 5\' 5"  (1.651 m)   Wt 181 lb 12.8 oz (82.5 kg)   SpO2 98%   BMI 30.25 kg/m  Wt Readings from Last 3 Encounters:  12/21/22 181 lb 12.8 oz (82.5 kg)  06/15/22 202 lb (91.6 kg)  05/07/22 201 lb 4 oz (91.3 kg)    Physical Exam   Gen: WDWN NAD HEENT: NCAT, conjunctiva not injected, sclera nonicteric NECK:  supple, no thyromegaly, no nodes, no carotid bruits CARDIAC: RRR, S1S2+, no murmur. DP 2+B LUNGS: CTAB. No wheezes ABDOMEN:  BS+, soft, NTND, No HSM, no masses EXT:  no edema MSK: no gross abnormalities.   NEURO: A&O x3.  CN II-XII intact.  PSYCH: normal mood. Good eye contact     Assessment & Plan:  There are no diagnoses linked to this encounter.  No follow-ups on file.  Angelena Sole, MD

## 2022-12-21 NOTE — Patient Instructions (Signed)

## 2022-12-22 NOTE — Assessment & Plan Note (Signed)
Chronic.  Well-controlled on Singulair 10 mg daily.  Continue.  She has Symbicort to use when flaring (mostly during upper respiratory infections).  She would like an albuterol inhaler to have on hand.

## 2023-01-07 ENCOUNTER — Ambulatory Visit (INDEPENDENT_AMBULATORY_CARE_PROVIDER_SITE_OTHER): Payer: No Typology Code available for payment source | Admitting: Family

## 2023-01-07 ENCOUNTER — Encounter: Payer: Self-pay | Admitting: Family

## 2023-01-07 VITALS — BP 119/83 | HR 67 | Temp 97.7°F | Ht 65.0 in | Wt 180.2 lb

## 2023-01-07 DIAGNOSIS — M545 Low back pain, unspecified: Secondary | ICD-10-CM | POA: Diagnosis not present

## 2023-01-07 MED ORDER — CYCLOBENZAPRINE HCL 5 MG PO TABS
5.0000 mg | ORAL_TABLET | Freq: Three times a day (TID) | ORAL | 1 refills | Status: DC | PRN
Start: 2023-01-07 — End: 2023-06-20

## 2023-01-07 NOTE — Progress Notes (Signed)
Patient ID: Chelsea Drake, female    DOB: 1976-02-21, 47 y.o.   MRN: [id number]  Chief Complaint  Patient presents with   Back Pain    Pt c/o lower back pain since last night, Has tried aleve and icy/hot which did help slightly.     HPI:      Low back pain:  hurts on left side, happened before but on both sides about 7 mos ago & seen chiropractor which helped a little. She was unable to sleep and moving certain way causes sharp pain. Pt denies any unusual activity or exercise to bring on the pain.    Assessment & Plan:  1. Left lumbar pain - sending generic Flexeril, advised on use & SE, ok to take with generic Advil (up to 600mg ) or Naproxen (1-2 tabs) or can alternate doses with Flexeril for pain control. Continue to try heating pad tid prn and/or analgesic creams/patches prn.  Avoid any activity that worsens pain for the next 2 weeks.    - cyclobenzaprine (FLEXERIL) 5 MG tablet; Take 1-2 tablets (5-10 mg total) by mouth 3 (three) times daily as needed for muscle spasms.  Dispense: 30 tablet; Refill: 1    Subjective:    Outpatient Medications Prior to Visit  Medication Sig Dispense Refill   albuterol (VENTOLIN HFA) 108 (90 Base) MCG/ACT inhaler Inhale 2 puffs into the lungs every 4 (four) hours as needed for wheezing or shortness of breath. 8.5 each 0   budesonide-formoterol (SYMBICORT) 80-4.5 MCG/ACT inhaler TAKE 2 PUFFS BY MOUTH TWICE A DAY     CALCIUM PO Take by mouth daily.     cetirizine (ZYRTEC) 10 MG tablet Take by mouth.     Fluticasone Propionate (FLONASE NA) Place into the nose.     hydrOXYzine (ATARAX/VISTARIL) 25 MG tablet TAKE 1/2 -1 TABLETS BY MOUTH DAILY AS NEEDED FOR ANXIETY. 90 tablet 1   Insulin Pen Needle 31G X 5 MM MISC Inject 1 each into the skin daily. 100 each 0   Ketotifen Fumarate (ALAWAY OP) Apply to eye daily.     Liraglutide -Weight Management (SAXENDA) 18 MG/3ML SOPN Inject 2.4 mg into the skin daily as directed for week 4,then week 5 & thereafter  inject 3 mg daily as directed 15 mL 2   Liraglutide -Weight Management 18 MG/3ML SOPN Inject 0.6 mg into the skin daily.     montelukast (SINGULAIR) 10 MG tablet Take 1 tablet (10 mg total) by mouth at bedtime. 90 tablet 3   Multiple Vitamin (MULTIVITAMIN WITH MINERALS) TABS tablet Take 1 tablet by mouth daily.     Norethindrone-Ethinyl Estradiol-Fe Biphas (LO LOESTRIN FE) 1 MG-10 MCG / 10 MCG tablet Take by mouth.     ondansetron (ZOFRAN-ODT) 4 MG disintegrating tablet Take 1 tablet (4 mg total) by mouth every 8 (eight) hours as needed for nausea or vomiting. 20 tablet 1   pseudoephedrine (SUDAFED) 60 MG tablet Take 1 tablet (60 mg total) by mouth every 8 (eight) hours as needed for congestion. 30 tablet 0   spironolactone (ALDACTONE) 100 MG tablet Take 100 mg by mouth daily.     No facility-administered medications prior to visit.   Past Medical History:  Diagnosis Date   Allergy    Asthma    Past Surgical History:  Procedure Laterality Date   FINGER SURGERY Right    5th digit   No Known Allergies    Objective:    Physical Exam Vitals and nursing note reviewed.  Constitutional:  Appearance: Normal appearance.  Cardiovascular:     Rate and Rhythm: Normal rate and regular rhythm.  Pulmonary:     Effort: Pulmonary effort is normal.     Breath sounds: Normal breath sounds.  Musculoskeletal:     Cervical back: Normal.     Thoracic back: Normal.     Lumbar back: Spasms and tenderness present. No bony tenderness. Decreased range of motion.  Skin:    General: Skin is warm and dry.  Neurological:     Mental Status: She is alert.  Psychiatric:        Mood and Affect: Mood normal.        Behavior: Behavior normal.    BP 119/83 (BP Location: Left Arm, Patient Position: Sitting, Cuff Size: Large)   Pulse 67   Temp 97.7 F (36.5 C) (Temporal)   Ht 5\' 5"  (1.651 m)   Wt 180 lb 3.2 oz (81.7 kg)   LMP 12/12/2022 (Approximate)   SpO2 97%   BMI 29.99 kg/m  Wt Readings from  Last 3 Encounters:  01/07/23 180 lb 3.2 oz (81.7 kg)  12/21/22 181 lb 12.8 oz (82.5 kg)  06/15/22 202 lb (91.6 kg)       Dulce Sellar, NP

## 2023-01-15 ENCOUNTER — Other Ambulatory Visit (HOSPITAL_BASED_OUTPATIENT_CLINIC_OR_DEPARTMENT_OTHER): Payer: Self-pay

## 2023-01-15 MED ORDER — SAXENDA 18 MG/3ML ~~LOC~~ SOPN
3.0000 mg | PEN_INJECTOR | Freq: Every day | SUBCUTANEOUS | 0 refills | Status: DC
  Filled 2023-01-15: qty 15, 30d supply, fill #0
  Filled 2023-02-12: qty 15, 30d supply, fill #1

## 2023-02-14 ENCOUNTER — Other Ambulatory Visit (HOSPITAL_BASED_OUTPATIENT_CLINIC_OR_DEPARTMENT_OTHER): Payer: Self-pay

## 2023-04-05 LAB — HM COLONOSCOPY

## 2023-04-26 ENCOUNTER — Other Ambulatory Visit: Payer: Self-pay | Admitting: *Deleted

## 2023-04-26 DIAGNOSIS — J4541 Moderate persistent asthma with (acute) exacerbation: Secondary | ICD-10-CM

## 2023-04-26 MED ORDER — ALBUTEROL SULFATE HFA 108 (90 BASE) MCG/ACT IN AERS
2.0000 | INHALATION_SPRAY | RESPIRATORY_TRACT | 0 refills | Status: DC | PRN
Start: 2023-04-26 — End: 2023-05-31

## 2023-04-28 ENCOUNTER — Other Ambulatory Visit: Payer: Self-pay | Admitting: Family Medicine

## 2023-05-28 ENCOUNTER — Telehealth: Payer: Self-pay | Admitting: Family Medicine

## 2023-05-28 NOTE — Telephone Encounter (Signed)
Pt states she had received the generic for  albuterol (VENTOLIN HFA) 108 (90 Base) MCG/ACT inhaler  On her last RX but she did nlt think it worked as well and would like the brand name or future refills. If it needs PA for insurance please call her because she will do everything she can to get the brand name. Please advise.

## 2023-05-28 NOTE — Telephone Encounter (Signed)
Noted  

## 2023-05-31 ENCOUNTER — Other Ambulatory Visit: Payer: Self-pay | Admitting: Family Medicine

## 2023-05-31 DIAGNOSIS — J4541 Moderate persistent asthma with (acute) exacerbation: Secondary | ICD-10-CM

## 2023-05-31 MED ORDER — ALBUTEROL SULFATE HFA 108 (90 BASE) MCG/ACT IN AERS
2.0000 | INHALATION_SPRAY | RESPIRATORY_TRACT | 0 refills | Status: DC | PRN
Start: 2023-05-31 — End: 2023-05-31

## 2023-05-31 NOTE — Telephone Encounter (Signed)
Noted  

## 2023-05-31 NOTE — Telephone Encounter (Signed)
Patient called for an update. I informed pt that PCP was out of office. Pt verbalized understanding and thanked for for update.

## 2023-06-20 ENCOUNTER — Ambulatory Visit: Payer: No Typology Code available for payment source | Admitting: Family Medicine

## 2023-06-20 ENCOUNTER — Encounter: Payer: Self-pay | Admitting: Family Medicine

## 2023-06-20 VITALS — BP 126/78 | HR 78 | Temp 98.1°F | Resp 16 | Ht 65.0 in | Wt 169.5 lb

## 2023-06-20 DIAGNOSIS — Z0000 Encounter for general adult medical examination without abnormal findings: Secondary | ICD-10-CM

## 2023-06-20 DIAGNOSIS — J4520 Mild intermittent asthma, uncomplicated: Secondary | ICD-10-CM

## 2023-06-20 MED ORDER — MONTELUKAST SODIUM 10 MG PO TABS: 10.0000 mg | ORAL_TABLET | Freq: Every day | ORAL | 3 refills | Status: DC

## 2023-06-20 NOTE — Progress Notes (Signed)
Phone: [phone]   Subjective:  Patient 47 y.o. female presenting for annual physical.  Chief Complaint  Patient presents with   Annual Exam    GYN care- Dr. Adrian Blackwater on diet/exercise.  Taking saxenda-has lost 40#.   Did labs at [facility] thru gyn Sinusitis-went to UC and placed on augmentin-but not taken yet. Getting some better.  Still really congested.  No f/c.   Taking symbicort  See problem oriented charting- ROS- ROS: Gen: no fever, chills  Skin: no rash, itching.  Some hair loss-taking spironolactone and minoxidil ENT: no ear pain, ear drainage, nasal congestion, rhinorrhea, sinus pressure, sore throat.  Just URI now Eyes: no blurry vision, double vision Resp: no cough, wheeze,SOB CV: no CP, palpitations, LE edema,  GI: no heartburn, n/v/d/c, abd pain.  Some constipation occ from Saxenda-taking fiber.  GU: no dysuria, urgency, frequency, hematuria.  Spotting for periods.  MSK: no joint pain, myalgias, back pain Neuro: no dizziness, headache, weakness, vertigo Psych: no depression, anxiety, insomnia, SI .   Had some anxiety so on wellbutrin-working well.   The following were reviewed and entered/updated in epic: Past Medical History:  Diagnosis Date   Allergy    Asthma    Patient Active Problem List   Diagnosis Date Noted   Encounter for screening for other metabolic disorders 04/05/2022   Obesity 09/07/2020   Premenstrual tension syndrome 09/07/2020   Secondary amenorrhea 09/07/2020   Alopecia 10/20/2019   Moderate persistent asthma 10/20/2019   Non-seasonal allergic rhinitis 10/20/2019   Perioral dermatitis 10/20/2019   Asthma 01/21/2019   Anxiety 01/21/2019   Seasonal allergies 01/21/2019   Past Surgical History:  Procedure Laterality Date   FINGER SURGERY Right    5th digit    Family History  Problem Relation Age of Onset   Asthma Father    Hypertrophic cardiomyopathy Father     Medications- reviewed and updated Current  Outpatient Medications  Medication Sig Dispense Refill   budesonide-formoterol (SYMBICORT) 80-4.5 MCG/ACT inhaler TAKE 2 PUFFS BY MOUTH TWICE A DAY     buPROPion (WELLBUTRIN SR) 150 MG 12 hr tablet Take 150 mg by mouth 2 (two) times daily.     CALCIUM PO Take by mouth daily.     cetirizine (ZYRTEC) 10 MG tablet Take by mouth.     Fluticasone Propionate (FLONASE NA) Place into the nose.     hydrOXYzine (ATARAX/VISTARIL) 25 MG tablet TAKE 1/2 -1 TABLETS BY MOUTH DAILY AS NEEDED FOR ANXIETY. 90 tablet 1   Insulin Pen Needle 31G X 5 MM MISC Inject 1 each into the skin daily. 100 each 0   Ketotifen Fumarate (ALAWAY OP) Apply to eye daily.     levalbuterol (XOPENEX HFA) 45 MCG/ACT inhaler Inhale 2 puffs into the lungs every 6 (six) hours as needed for wheezing. 2 each 1   Liraglutide -Weight Management (SAXENDA) 18 MG/3ML SOPN Inject 3 mg into the skin daily. 36 mL 0   Multiple Vitamin (MULTIVITAMIN WITH MINERALS) TABS tablet Take 1 tablet by mouth daily.     Norethindrone-Ethinyl Estradiol-Fe Biphas (LO LOESTRIN FE) 1 MG-10 MCG / 10 MCG tablet Take by mouth.     spironolactone (ALDACTONE) 100 MG tablet Take 100 mg by mouth daily.     montelukast (SINGULAIR) 10 MG tablet Take 1 tablet (10 mg total) by mouth at bedtime. 90 tablet 3   ondansetron (ZOFRAN-ODT) 4 MG disintegrating tablet Take 1 tablet (4 mg total) by mouth every 8 (eight) hours as needed  for nausea or vomiting. (Patient not taking: Reported on 06/20/2023) 20 tablet 1   No current facility-administered medications for this visit.    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 DP 2+B LUNGS: CTAB. No wheezes ABDOMEN:  BS+, soft, NTND, No HSM, no masses EXT:  no edema MSK: no gross abnormalities. MS 5/5 all 4 NEURO: A&O x3.  CN II-XII intact.  PSYCH: normal mood. Good eye contact   Labs 06/05/23 gluc 85 uric 6 BUN 19 cr 1.13 na 136 k 4.9 cl 103 ca 9.8 p 2.9 tp 6.9 alb 4.1 bili <0.2 ap 52 ldh 129 ast 17 alt 11 ggt 14 tc 205 trig 118 hdl 75 ldl 109 ratio 2.7  tsh 1.64  wbc 7.9 rbc 4.21 hgb 12.6 hct 38.6 plt 552(chronic)  saw heme long time ago(20 yrs).  Had BM bx 20 yrs ago.     Assessment and Plan   Health Maintenance counseling: 1. Anticipatory guidance: Patient counseled regarding regular dental exams q6 months, eye exams yearly, avoiding smoking and second hand smoke, limiting alcohol to 2 beverages per day.   2. Risk factor reduction:  Advised patient of need for regular exercise and diet rich in fruits and vegetables to reduce risk of heart attack and stroke. Exercise- +.   Wt Readings from Last 3 Encounters:  06/20/23 169 lb 8 oz (76.9 kg)  01/07/23 180 lb 3.2 oz (81.7 kg)  12/21/22 181 lb 12.8 oz (82.5 kg)   3. Immunizations/screenings/ancillary studies Immunization History  Administered Date(s) Administered   Influenza Inj Mdck Quad Pf 03/31/2019   Influenza Split 03/16/2013, 05/17/2019   Influenza, Mdck, Trivalent,PF 6+ MOS(egg free) 04/23/2023   Influenza,inj,Quad PF,6+ Mos 04/28/2018, 04/14/2020, 04/03/2021   Influenza-Unspecified 05/09/2016, 05/09/2019, 04/14/2020, 04/15/2021   PFIZER(Purple Top)SARS-COV-2 Vaccination 10/22/2019, 11/21/2019, 05/30/2020   Pfizer Covid-19 Vaccine Bivalent Booster 24yrs & up 05/14/2021   Tdap 07/16/2010, 08/28/2013, 06/24/2014   There are no preventive care reminders to display for this patient.  4. Skin cancer screening- Iadvised regular sunscreen use. Denies worrisome, changing, or new skin lesions.  5. Smoking  associated screening: non smoker  Wellness examination  Mild intermittent asthma without complication  Other orders -     Montelukast Sodium; Take 1 tablet (10 mg total) by mouth at bedtime.  Dispense: 90 tablet; Refill: 3   Wellness-antic guidance.  RHM UTD.  Labs ok x plts(chronic) Asthma-chronic stable.  Cont singulair 10mg  daily  Recommended follow up: Return in about 1 year (around 06/19/2024) for annual physical.  Lab/Order associations:not fasting  Angelena Sole, MD

## 2023-06-20 NOTE — Patient Instructions (Signed)

## 2023-07-26 ENCOUNTER — Other Ambulatory Visit: Payer: Self-pay | Admitting: Family Medicine

## 2023-09-16 ENCOUNTER — Other Ambulatory Visit (HOSPITAL_BASED_OUTPATIENT_CLINIC_OR_DEPARTMENT_OTHER): Payer: Self-pay

## 2023-09-16 MED ORDER — ZEPBOUND 2.5 MG/0.5ML ~~LOC~~ SOAJ
2.5000 mg | SUBCUTANEOUS | 0 refills | Status: DC
  Filled 2023-09-16: qty 2, 28d supply, fill #0

## 2023-09-22 ENCOUNTER — Other Ambulatory Visit: Payer: Self-pay | Admitting: Family Medicine

## 2023-09-22 DIAGNOSIS — J4541 Moderate persistent asthma with (acute) exacerbation: Secondary | ICD-10-CM

## 2023-10-14 ENCOUNTER — Other Ambulatory Visit: Payer: Self-pay | Admitting: Family Medicine

## 2023-10-14 DIAGNOSIS — F419 Anxiety disorder, unspecified: Secondary | ICD-10-CM

## 2023-10-14 MED ORDER — HYDROXYZINE HCL 25 MG PO TABS: ORAL_TABLET | ORAL | 2 refills | Status: DC

## 2023-10-14 NOTE — Telephone Encounter (Signed)
 Copied from CRM [id number]. Topic: Clinical - Medication Refill >> Oct 14, 2023 10:50 AM [name] wrote: Most Recent Primary Care Visit:  Provider: Lutricia Horsfall MARIE  Department: [facility]  Visit Type: PHYSICAL  Date: 06/20/2023  Medication: hydrOXYzine (ATARAX/VISTARIL) 25 MG tablet  Has the patient contacted their pharmacy? Yes, no prescription on file (Agent: If no, request that the patient contact the pharmacy for the refill. If patient does not wish to contact the pharmacy document the reason why and proceed with request.) (Agent: If yes, when and what did the pharmacy advise?)  Is this the correct pharmacy for this prescription? Yes If no, delete pharmacy and type the correct one.  This is the patient's preferred pharmacy:  [id number] [place], [place] - [address] [place] [address] Phone: [phone] Fax: [phone]   Has the prescription been filled recently? No  Is the patient out of the medication? No  Has the patient been seen for an appointment in the last year OR does the patient have an upcoming appointment? Yes  Can we respond through MyChart? Yes  Agent: Please be advised that Rx refills may take up to 3 business days. We ask that you follow-up with your pharmacy.

## 2023-10-14 NOTE — Telephone Encounter (Signed)
 Duplicate request, closing encounter.

## 2023-10-14 NOTE — Telephone Encounter (Signed)
 Copied from CRM [id number]. Topic: Clinical - Medication Refill >> Oct 14, 2023 10:46 AM [name] wrote: Most Recent Primary Care Visit:  Provider: Lutricia Horsfall MARIE  Department: [facility]  Visit Type: PHYSICAL  Date: 06/20/2023  Medication: hydrOXYzine (ATARAX/VISTARIL) 25 MG tablet  Has the patient contacted their pharmacy? Yes, pharmacy doesn't have a record (Agent: If no, request that the patient contact the pharmacy for the refill. If patient does not wish to contact the pharmacy document the reason why and proceed with request.) (Agent: If yes, when and what did the pharmacy advise?)  Is this the correct pharmacy for this prescription? Yes If no, delete pharmacy and type the correct one.  This is the patient's preferred pharmacy:  [id number] [place], [place] - [address] [place] [address] Phone: [phone] Fax: [phone]   Has the prescription been filled recently? No  Is the patient out of the medication? No  Has the patient been seen for an appointment in the last year OR does the patient have an upcoming appointment? Yes  Can we respond through MyChart? Yes  Agent: Please be advised that Rx refills may take up to 3 business days. We ask that you follow-up with your pharmacy.

## 2023-10-21 ENCOUNTER — Other Ambulatory Visit (HOSPITAL_BASED_OUTPATIENT_CLINIC_OR_DEPARTMENT_OTHER): Payer: Self-pay

## 2023-10-21 MED ORDER — TIRZEPATIDE-WEIGHT MANAGEMENT 5 MG/0.5ML ~~LOC~~ SOAJ
5.0000 mg | SUBCUTANEOUS | 0 refills | Status: DC
  Filled 2023-10-21: qty 2, 28d supply, fill #0

## 2023-11-06 DIAGNOSIS — J0101 Acute recurrent maxillary sinusitis: Secondary | ICD-10-CM | POA: Insufficient documentation

## 2023-11-08 ENCOUNTER — Ambulatory Visit: Admitting: Physician Assistant

## 2023-11-08 ENCOUNTER — Ambulatory Visit: Payer: Self-pay

## 2023-11-08 ENCOUNTER — Encounter: Payer: Self-pay | Admitting: Physician Assistant

## 2023-11-08 VITALS — BP 114/74 | HR 99 | Temp 97.2°F | Ht 65.0 in | Wt 163.2 lb

## 2023-11-08 DIAGNOSIS — R519 Headache, unspecified: Secondary | ICD-10-CM

## 2023-11-08 DIAGNOSIS — J029 Acute pharyngitis, unspecified: Secondary | ICD-10-CM

## 2023-11-08 DIAGNOSIS — R051 Acute cough: Secondary | ICD-10-CM | POA: Diagnosis not present

## 2023-11-08 LAB — POCT INFLUENZA A/B
Influenza A, POC: NEGATIVE
Influenza B, POC: NEGATIVE

## 2023-11-08 LAB — POC COVID19 BINAXNOW: SARS Coronavirus 2 Ag: NEGATIVE

## 2023-11-08 MED ORDER — DOXYCYCLINE HYCLATE 100 MG PO TABS: 100.0000 mg | ORAL_TABLET | Freq: Two times a day (BID) | ORAL | 0 refills | Status: AC

## 2023-11-08 NOTE — Progress Notes (Signed)
 Patient ID: Chelsea Drake, female    DOB: 1975/08/30, 48 y.o.   MRN: [id number]   Assessment & Plan:  Acute nonintractable headache, unspecified headache type -     POC COVID-19 BinaxNow -     POCT Influenza A/B  Acute sore throat -     POC COVID-19 BinaxNow -     POCT Influenza A/B  Acute cough -     POC COVID-19 BinaxNow -     POCT Influenza A/B  Other orders -     Doxycycline  Hyclate; Take 1 tablet (100 mg total) by mouth 2 (two) times daily for 7 days.  Dispense: 14 tablet; Refill: 0     Assessment & Plan Acute URI / sinusitis Symptoms going on weeks, have worsened significantly since Tuesday. Differential includes viral infection such as influenza, given the sudden change in symptoms and ongoing flu activity in the community. COVID-19 test was negative. Throat examination shows irritation without exudate. Considering antibiotics due to severity of symptoms and potential bacterial infection. - COVID and flu testing negative in office, POC strep test invalid - Start on doxycycline  as discussed, Pt aware of risks vs benefits and possible adverse reactions. -Rest, fluids, OTC supportive as well  F/up prn    Subjective:    Chief Complaint  Patient presents with   Sinusitis    Pt in office for acute concerns with severe sinus symptoms for past few weeks; has turned into cough, congestion sore throat and discolored mucus at times. Pt admits getting seasonal allergies and not uncommon, and takes medication regularly; Tuesday got so much worse, started having some body aches, no OTC meds relieved symptoms.    HPI Discussed the use of AI scribe software for clinical note transcription with the patient, who gave verbal consent to proceed.  History of Present Illness Chelsea Drake "Chelsea Drake" is a 48 year old female with asthma who presents with worsening respiratory symptoms and body aches.  She has been experiencing a persistent cough for several weeks, initially  resembling allergies but worsening significantly this week. Intense sinus pressure, severe body aches, and exhaustion began on Tuesday. Her symptoms are more severe than typical allergies, with a sore throat described as 'swallowing glass' and difficulty drinking even warm liquids.  She has been taking Tylenol, Zyrtec, Flonase , eye drops, Sudafed, and saline with minimal relief. She wakes up every four hours due to severe body aches, which are temporarily alleviated by Tylenol. She also experiences clogged ears and has been sleeping almost ten hours each night but with frequent awakenings.  No fever, but she has experienced body aches and chills. She has a history of not developing a fever with past illnesses such as COVID-19, strep throat, and sinus infections. She recalls a possible insect bite on her stomach that was red with a white center but not a pronounced bullseye, and she did not see a tick.  She has no pets due to allergies and has not been around anyone known to be sick, except for a colleague who visited a clinic recently. She attended a large music event last weekend, which she considers a potential exposure risk.  She visited a health clinic at work on Tuesday, where she was given a Z-Pak for a previous sinus infection but has not taken it yet.     Past Medical History:  Diagnosis Date   Allergy    Asthma     Past Surgical History:  Procedure Laterality Date   FINGER  SURGERY Right    5th digit    Family History  Problem Relation Age of Onset   Asthma Father    Hypertrophic cardiomyopathy Father     Social History   Tobacco Use   Smoking status: Never   Smokeless tobacco: Never  Vaping Use   Vaping status: Never Used  Substance Use Topics   Alcohol use: Yes    Comment: occ   Drug use: Yes    Types: Marijuana     No Known Allergies  Review of Systems NEGATIVE UNLESS OTHERWISE INDICATED IN HPI      Objective:     BP 114/74 (BP Location: Left Arm,  Patient Position: Sitting, Cuff Size: Normal)   Pulse 99   Temp (!) 97.2 F (36.2 C) (Temporal)   Ht 5\' 5"  (1.651 m)   Wt 163 lb 3.2 oz (74 kg)   SpO2 96%   BMI 27.16 kg/m   Wt Readings from Last 3 Encounters:  11/08/23 163 lb 3.2 oz (74 kg)  06/20/23 169 lb 8 oz (76.9 kg)  01/07/23 180 lb 3.2 oz (81.7 kg)    BP Readings from Last 3 Encounters:  11/08/23 114/74  06/20/23 126/78  01/07/23 119/83     Physical Exam Vitals and nursing note reviewed.  Constitutional:      General: She is not in acute distress.    Appearance: Normal appearance. She is not ill-appearing.  HENT:     Head: Normocephalic.     Right Ear: Tympanic membrane, ear canal and external ear normal.     Left Ear: Tympanic membrane, ear canal and external ear normal.     Nose: Congestion present.     Mouth/Throat:     Mouth: Mucous membranes are moist.     Pharynx: Posterior oropharyngeal erythema present. No oropharyngeal exudate.  Eyes:     Extraocular Movements: Extraocular movements intact.     Conjunctiva/sclera: Conjunctivae normal.     Pupils: Pupils are equal, round, and reactive to light.  Cardiovascular:     Rate and Rhythm: Normal rate and regular rhythm.     Pulses: Normal pulses.     Heart sounds: Normal heart sounds. No murmur heard. Pulmonary:     Effort: Pulmonary effort is normal. No respiratory distress.     Breath sounds: Normal breath sounds. No wheezing.  Musculoskeletal:     Cervical back: Normal range of motion.  Skin:    General: Skin is warm.  Neurological:     Mental Status: She is alert and oriented to person, place, and time.  Psychiatric:        Mood and Affect: Mood normal.        Behavior: Behavior normal.             Chelsea Drake M Twanna Resh, PA-C

## 2023-11-08 NOTE — Telephone Encounter (Signed)
 Copied from CRM [id number]. Topic: Clinical - Red Word Triage >> Nov 08, 2023  8:06 AM Elle L wrote: Red Word that prompted transfer to Nurse Triage: The patient thought she had allergies but it is worsening. She states she is having terrible sinus pressure, congestion, cough, body aches, fatigue, headaches, it feels like her throat is on fire, and discolored mucus.  Chief Complaint: sinus pain/pressure Symptoms: cough, sore throat, runny stuffy nose, body aches Frequency: constant Pertinent Negatives: Patient denies fever, sob Disposition: [] ED /[] Urgent Care (no appt availability in office) / [x] Appointment(In office/virtual)/ []  [facility]/ [] Home Care/ [] Refused Recommended Disposition /[] [facility]/ []  Follow-up with PCP Additional Notes: per protocol apt made for this am; care advice given, denies questions; instructed to go to ER if becomes worse.   Reason for Disposition  [1] SEVERE pain AND [2] not improved 2 hours after pain medicine  Answer Assessment - Initial Assessment Questions 1. LOCATION: "Where does it hurt?"      Facial pain and pressure 2. ONSET: "When did the sinus pain start?"  (e.g., hours, days)      A few weeks 3. SEVERITY: "How bad is the pain?"   (Scale 1-10; mild, moderate or severe)   - MILD (1-3): doesn't interfere with normal activities    - MODERATE (4-7): interferes with normal activities (e.g., work or school) or awakens from sleep   - SEVERE (8-10): excruciating pain and patient unable to do any normal activities        Feels really crappy. 4. RECURRENT SYMPTOM: "Have you ever had sinus problems before?" If Yes, ask: "When was the last time?" and "What happened that time?"      Yes  5. NASAL CONGESTION: "Is the nose blocked?" If Yes, ask: "Can you open it or must you breathe through your mouth?"     Runny nose 6. NASAL DISCHARGE: "Do you have discharge from your nose?" If so ask, "What color?"     Mostly clear but has been  green 7. FEVER: "Do you have a fever?" If Yes, ask: "What is it, how was it measured, and when did it start?"      no 8. OTHER SYMPTOMS: "Do you have any other symptoms?" (e.g., sore throat, cough, earache, difficulty breathing)     Cough, sore throat 9. PREGNANCY: "Is there any chance you are pregnant?" "When was your last menstrual period?"     na  Protocols used: Sinus Pain or Congestion-A-AH

## 2023-11-08 NOTE — Telephone Encounter (Signed)
 Noted.

## 2023-11-14 ENCOUNTER — Other Ambulatory Visit (HOSPITAL_BASED_OUTPATIENT_CLINIC_OR_DEPARTMENT_OTHER): Payer: Self-pay

## 2023-11-16 ENCOUNTER — Other Ambulatory Visit (HOSPITAL_BASED_OUTPATIENT_CLINIC_OR_DEPARTMENT_OTHER): Payer: Self-pay

## 2023-11-18 ENCOUNTER — Other Ambulatory Visit (HOSPITAL_BASED_OUTPATIENT_CLINIC_OR_DEPARTMENT_OTHER): Payer: Self-pay

## 2023-11-18 MED ORDER — TIRZEPATIDE-WEIGHT MANAGEMENT 5 MG/0.5ML ~~LOC~~ SOAJ
5.0000 mg | SUBCUTANEOUS | 0 refills | Status: DC
  Filled 2023-11-18: qty 2, 28d supply, fill #0

## 2023-11-22 ENCOUNTER — Ambulatory Visit: Admitting: Family Medicine

## 2023-11-22 ENCOUNTER — Encounter: Payer: Self-pay | Admitting: Family Medicine

## 2023-11-22 VITALS — BP 128/92 | HR 80 | Temp 97.7°F | Resp 18 | Ht 65.0 in | Wt 160.1 lb

## 2023-11-22 DIAGNOSIS — R519 Headache, unspecified: Secondary | ICD-10-CM

## 2023-11-22 MED ORDER — GABAPENTIN 300 MG PO CAPS: 300.0000 mg | ORAL_CAPSULE | Freq: Three times a day (TID) | ORAL | 1 refills | Status: DC

## 2023-11-22 MED ORDER — VALACYCLOVIR HCL 1 G PO TABS: 1000.0000 mg | ORAL_TABLET | Freq: Three times a day (TID) | ORAL | 0 refills | Status: AC

## 2023-11-22 MED ORDER — FLUCONAZOLE 150 MG PO TABS: 150.0000 mg | ORAL_TABLET | ORAL | 0 refills | Status: DC | PRN

## 2023-11-22 MED ORDER — PREDNISONE 20 MG PO TABS: 40.0000 mg | ORAL_TABLET | Freq: Every day | ORAL | 0 refills | Status: AC

## 2023-11-22 NOTE — Progress Notes (Signed)
 Subjective:     Patient ID: Chelsea Drake, female    DOB: 1976-05-04, 48 y.o.   MRN: [id number]  Chief Complaint  Patient presents with   Pain    Pain on left side of face that started 2 days, sensitive around eye Went to UC yesterday    HPI Discussed the use of AI scribe software for clinical note transcription with the patient, who gave verbal consent to proceed.  History of Present Illness Chelsea Drake "[name]" is a 48 year old female who presents with left-sided facial pain.  She began experiencing unusual pain on the left side of her face on Wednesday night while in [place], [place] . Initially, there was sensitivity on her upper cheek near the orbital bone. By Thursday morning, the pain had spread to her jaw and temporal region, becoming more pronounced by the afternoon, extending from her forehead down to her jaw. The pain is sensitive to touch, particularly along the orbital bone and cheekbone, and is exacerbated by blinking. She describes the sensation as similar to being punched in the face or having sunburned skin. The discomfort is persistent, though not severe, and is not associated with sinus pain.  She visited urgent care the previous night but was dissatisfied with the consultation. She was advised to take steroids, which she declined due to past adverse reactions. She has not experienced any relief from Tylenol and has used Flonase  and eye drops without significant improvement. No fever, chills, or visual disturbances. She has not taken Sudafed due to concerns about blood pressure but tried Sudafed mucus, which provided some relief.  She has a history of anxiety and takes Wellbutrin. She reports high blood pressure readings at medical visits, which she attributes to anxiety. In the past, she experienced a similar outbreak in the genital area, initially suspected to be herpes, but tests returned negative. Her father speculated it might have been shingles,  though it was never confirmed. She recalls significant pain during that episode, which was managed with topical lidocaine and gabapentin.  She reports being under normal levels of stress due to a busy work schedule.    Health Maintenance Due  Topic Date Due   MAMMOGRAM  11/13/2023    Past Medical History:  Diagnosis Date   Allergy    Asthma     Past Surgical History:  Procedure Laterality Date   FINGER SURGERY Right    5th digit     Current Outpatient Medications:    buPROPion (WELLBUTRIN SR) 150 MG 12 hr tablet, Take 150 mg by mouth 2 (two) times daily., Disp: , Rfl:    CALCIUM PO, Take by mouth daily., Disp: , Rfl:    cetirizine (ZYRTEC) 10 MG tablet, Take by mouth., Disp: , Rfl:    fluconazole  (DIFLUCAN ) 150 MG tablet, Take 1 tablet (150 mg total) by mouth every three (3) days as needed., Disp: 2 tablet, Rfl: 0   Fluticasone  Propionate (FLONASE  NA), Place into the nose., Disp: , Rfl:    gabapentin (NEURONTIN) 300 MG capsule, Take 1 capsule (300 mg total) by mouth 3 (three) times daily., Disp: 30 capsule, Rfl: 1   hydrOXYzine  (ATARAX ) 25 MG tablet, TAKE 1/2 -1 TABLETS BY MOUTH DAILY AS NEEDED FOR ANXIETY., Disp: 90 tablet, Rfl: 2   Insulin  Pen Needle 31G X 5 MM MISC, Inject 1 each into the skin daily., Disp: 100 each, Rfl: 0   Ketotifen Fumarate (ALAWAY OP), Apply to eye daily., Disp: , Rfl:  levalbuterol (XOPENEX HFA) 45 MCG/ACT inhaler, INHALE 2 PUFFS INTO THE LUNGS EVERY 6 HOURS AS NEEDED FOR WHEEZE, Disp: 30 each, Rfl: 1   Liraglutide  -Weight Management (SAXENDA ) 18 MG/3ML SOPN, Inject 3 mg into the skin daily., Disp: 36 mL, Rfl: 0   montelukast  (SINGULAIR ) 10 MG tablet, Take 1 tablet (10 mg total) by mouth at bedtime., Disp: 90 tablet, Rfl: 3   Multiple Vitamin (MULTIVITAMIN WITH MINERALS) TABS tablet, Take 1 tablet by mouth daily., Disp: , Rfl:    Norethindrone-Ethinyl Estradiol-Fe Biphas (LO LOESTRIN FE) 1 MG-10 MCG / 10 MCG tablet, Take by mouth., Disp: , Rfl:     ondansetron  (ZOFRAN -ODT) 4 MG disintegrating tablet, Take 1 tablet (4 mg total) by mouth every 8 (eight) hours as needed for nausea or vomiting., Disp: 20 tablet, Rfl: 1   predniSONE  (DELTASONE ) 20 MG tablet, Take 2 tablets (40 mg total) by mouth daily with breakfast for 5 days., Disp: 10 tablet, Rfl: 0   spironolactone (ALDACTONE) 100 MG tablet, Take 100 mg by mouth daily., Disp: , Rfl:    SYMBICORT  80-4.5 MCG/ACT inhaler, INHALE 2 PUFFS INTO THE LUNGS IN THE MORNING AND AT BEDTIME, Disp: 10.2 each, Rfl: 3   tirzepatide  (ZEPBOUND ) 5 MG/0.5ML Pen, Inject 5 mg into the skin every 7 (seven) days., Disp: 2 mL, Rfl: 0   valACYclovir (VALTREX) 1000 MG tablet, Take 1 tablet (1,000 mg total) by mouth 3 (three) times daily for 10 days., Disp: 30 tablet, Rfl: 0  No Known Allergies ROS neg/noncontributory except as noted HPI/below      Objective:      BP (!) 128/92 (BP Location: Left Arm, Patient Position: Sitting, Cuff Size: Normal)   Pulse 80   Temp 97.7 F (36.5 C) (Temporal)   Resp 18   Ht 5\' 5"  (1.651 m)   Wt 160 lb 2 oz (72.6 kg)   SpO2 99%   BMI 26.65 kg/m  Wt Readings from Last 3 Encounters:  11/22/23 160 lb 2 oz (72.6 kg)  11/08/23 163 lb 3.2 oz (74 kg)  06/20/23 169 lb 8 oz (76.9 kg)    Physical Exam   Gen: WDWN NAD HEENT: NCAT, conjunctiva not injected, sclera nonicteric TM WNL B, OP moist, no exudates EOMI.   NECK:  supple, no thyromegaly, no nodes,  EXT:  no edema MSK: no gross abnormalities.  NEURO: A&O x3.  CN II-XII intact.  PSYCH: normal mood. Good eye contact  No rash.       Assessment & Plan:  Left-sided face pain  Other orders -     valACYclovir HCl; Take 1 tablet (1,000 mg total) by mouth 3 (three) times daily for 10 days.  Dispense: 30 tablet; Refill: 0 -     Gabapentin; Take 1 capsule (300 mg total) by mouth 3 (three) times daily.  Dispense: 30 capsule; Refill: 1 -     predniSONE ; Take 2 tablets (40 mg total) by mouth daily with breakfast for 5 days.   Dispense: 10 tablet; Refill: 0 -     Fluconazole ; Take 1 tablet (150 mg total) by mouth every three (3) days as needed.  Dispense: 2 tablet; Refill: 0  Assessment and Plan Assessment & Plan L face pain   Suspected shingles on the left side of the face, with pain from the forehead to the jaw, sensitivity to touch, and discomfort when blinking. No visible rash yet, but symptoms suggest shingles. Differential diagnosis includes Bell's palsy, trigeminal neuralgia, and sinus issues. She had a  similar episode previously, though not confirmed as shingles. Discussed potential rash development and associated severe pain. Valtrex is recommended as a benign treatment with minimal side effects, primarily diarrhea. Prednisone  is reserved for severe symptoms if a rash develops. Gabapentin is suggested for nerve pain if symptoms worsen. Advised on the contagious nature of shingles if a rash develops, particularly to unvaccinated children and immunocompromised individuals. Start Valtrex. Provide prescription for prednisone  if rash develops. Use ibuprofen and acetaminophen for pain management. Prescribe gabapentin for nerve pain if symptoms worsen. Consider topical lidocaine for pain relief. Avoid contact with immunocompromised individuals and unvaccinated children if rash develops.  Elevated bp Blood pressure is elevated at 128/92 mmHg. She reports it is often high during doctor visits but decreases upon rechecking. No new stressors reported. Recheck blood pressure at next visit. Monitor blood pressure at home if possible.  Anxiety   She experiences anxiety and sometimes allows small stressors to affect her more than they should. Currently taking Wellbutrin for anxiety management.    Return if symptoms worsen or fail to improve.  Ellsworth Haas, MD

## 2023-11-23 ENCOUNTER — Encounter: Payer: Self-pay | Admitting: Family Medicine

## 2023-11-23 NOTE — Patient Instructions (Signed)
 Take the valtrex.   Take prednisone  if rash develops Take the gabapentin for pain Motrin/tylenol for pain

## 2023-11-27 ENCOUNTER — Encounter: Payer: Self-pay | Admitting: Family Medicine

## 2023-11-28 ENCOUNTER — Ambulatory Visit: Admitting: Family

## 2023-11-28 ENCOUNTER — Ambulatory Visit: Payer: Self-pay

## 2023-11-28 ENCOUNTER — Encounter: Payer: Self-pay | Admitting: Family

## 2023-11-28 VITALS — BP 140/100 | HR 99 | Temp 97.5°F | Ht 65.0 in | Wt 158.6 lb

## 2023-11-28 DIAGNOSIS — R030 Elevated blood-pressure reading, without diagnosis of hypertension: Secondary | ICD-10-CM | POA: Diagnosis not present

## 2023-11-28 DIAGNOSIS — H9202 Otalgia, left ear: Secondary | ICD-10-CM | POA: Diagnosis not present

## 2023-11-28 NOTE — Telephone Encounter (Signed)
 Copied From CRM [id number]. Reason for Triage: patient was diagnosed with shingles last week ear ringing   Agent routed CRM to Triage Queue. Per agent, pt hung up before transfer to nurse. Called pt back.  Chief Complaint: ear pain and ringing, shingles Symptoms: constant left ear pain, ringing in ear(s), fatigue, current shingles diagnosis and meds, intermittent mild dizziness Frequency: continual Pertinent Negatives: Patient denies severe pain, noticeable decreased hearing loss, interrupted sleep Disposition: [] 911 / [] ED /[] Urgent Care (no appt availability in office) / [x] Appointment(In office/virtual)/ []  [facility]/ [] Home Care/ [] Refused Recommended Disposition /[] [facility]/ []  Follow-up with PCP Additional Notes: Pt reporting that she had called back to [facility] after disconnect from first agent before transfer complete to nurse, and other agent scheduled her for appt today without triage. Advised triage, pt agreed to this. Pt reporting that she was diagnosed with likely shingles last week, then just last night or the night before, started having ear pain to left ear and tinnitus, unable to tell if both or one ear ringing, also some mild dizziness intermittently that gets her "off-balance." Pt reporting that ear pain is constant, not as bad this morning, ringing is "not as consistent." Pt confirms no hearing loss that she can notice at this time. Advised pt be seen today, keep appt as scheduled, advised call back if worsening or new symptoms. Pt verbalized understanding.  Reason for Disposition  Ear is painful  Answer Assessment - Initial Assessment Questions 1. DESCRIPTION: "Describe the sound you are hearing." (e.g., buzzing, hissing, humming, ringing)     ringing 2. LOCATION: "Is the sound in one or both ears?" If one, ask: "Which ear?"     Left ear, maybe both 3. SEVERITY: "How bad is it?"    - MILD: Doesn't interfere with normal activities, only can hear in a  quiet room.    - MODERATE-SEVERE: interferes with work, school, sleep, or other activities.      Was able to watch tv, it's a little annoying, took something to help me sleep and tylenol for the pain, not as bad this morning but still really tired but have shingles and still on med 4. ONSET: "When did this begin?" "Did it start suddenly or come on gradually?"     Maybe night before last but definitely last night 5. PATTERN: "Does this come and go, or has it been constant since it started?"     Pain is constant but not as bad this morning, ringing is not as consistent 6. HEARING LOSS: "Is your hearing decreased?" (e.g., normal, decreased)       Not that I notice 7. OTHER SYMPTOMS: "Do you have any other symptoms?" (e.g., dizziness, earache)     Left ear hurts, couple times in last couple days when doing something and feel a little off-balance, med can make you off-balance but also ear stuff, not so dizzy that can't function  Protocols used: Tinnitus-A-AH

## 2023-11-28 NOTE — Telephone Encounter (Signed)
 Noted. Patient scheduled for today with [facility].

## 2023-11-28 NOTE — Progress Notes (Signed)
 Patient ID: Chelsea Drake, female    DOB: 26-Oct-1975, 48 y.o.   MRN: [id number]  Chief Complaint  Patient presents with   Ear Pain    Pt c/o pain in left ear and jaw. Dx with shingles last week. Pt c/o dizziness since Last night.   Discussed the use of AI scribe software for clinical note transcription with the patient, who gave verbal consent to proceed.  History of Present Illness Chelsea Drake "Chelsea Drake" is a 48 year old female with a history of shingles who presents with ear pain and symptoms related to shingles.  She experiences ear pain on the same side of her face where she previously had shingles. The pain began last night as a dull ache and was partially relieved by Tylenol. This morning, the ear pain persists, accompanied by increased body aches. She was diagnosed with shingles recently, with initial facial pain improving after starting Valtrex. Over the weekend, she felt relatively fine, but experienced significant body aches and lethargy on Monday and Tuesday, with Tuesday being the worst. She did not work on Tuesday due to fatigue. No blisters have developed yet.  Jaw soreness began today and is continuous, not relieved by Tylenol. There is no tingling, burning, or numbness in the face, but her skin feels 'super sensitive' like a sunburn without the burning sensation. She has not taken gabapentin or prednisone , which were prescribed for potential worsening of symptoms.  She felt slightly off balance twice recently, possibly related to ear symptoms or medication side effects. There is no vertigo, nausea, or headaches. She experienced some ringing in her ear last night. Blood pressure readings have fluctuated, with a recent reading of 116/102.  Assessment & Plan Shingles Shingles with facial pain, earache, and jaw soreness. No blisters, facial paralysis, or eye involvement. Symptoms may relate to shingles or medication side effects. Ear exam normal, no vesicles noted in ear canal,  tender cervical lymph nodes expected to resolve. Informed about medication side effects and prednisone  use if symptoms worsen. - Continue Valtrex as prescribed. - Use acetaminophen or naproxen for pain management. - Report any symptom worsening. - No need for gabapentin or prednisone  unless symptoms worsen.  Hypertension Intermittent elevated blood pressure, possibly due to white coat syndrome. No consistent hypertension pattern. No headache or dizziness. Advised on home monitoring and lifestyle modifications. - Monitor blood pressure at home with electronic cuff (pt reports father sending cuff in mail) - Maintain hydration with 2L daily and monitor salt and caffeine intake. - Follow up if home readings consistently show elevated blood pressure >135/90, either number.   Subjective:     Outpatient Medications Prior to Visit  Medication Sig Dispense Refill   buPROPion (WELLBUTRIN SR) 150 MG 12 hr tablet Take 150 mg by mouth 2 (two) times daily.     CALCIUM PO Take by mouth daily.     cetirizine (ZYRTEC) 10 MG tablet Take by mouth.     fluconazole  (DIFLUCAN ) 150 MG tablet Take 1 tablet (150 mg total) by mouth every three (3) days as needed. 2 tablet 0   Fluticasone  Propionate (FLONASE  NA) Place into the nose.     gabapentin (NEURONTIN) 300 MG capsule Take 1 capsule (300 mg total) by mouth 3 (three) times daily. 30 capsule 1   hydrOXYzine  (ATARAX ) 25 MG tablet TAKE 1/2 -1 TABLETS BY MOUTH DAILY AS NEEDED FOR ANXIETY. 90 tablet 2   Insulin  Pen Needle 31G X 5 MM MISC Inject 1 each into the skin  daily. 100 each 0   Ketotifen Fumarate (ALAWAY OP) Apply to eye daily.     levalbuterol (XOPENEX HFA) 45 MCG/ACT inhaler INHALE 2 PUFFS INTO THE LUNGS EVERY 6 HOURS AS NEEDED FOR WHEEZE 30 each 1   Liraglutide  -Weight Management (SAXENDA ) 18 MG/3ML SOPN Inject 3 mg into the skin daily. 36 mL 0   montelukast  (SINGULAIR ) 10 MG tablet Take 1 tablet (10 mg total) by mouth at bedtime. 90 tablet 3   Multiple  Vitamin (MULTIVITAMIN WITH MINERALS) TABS tablet Take 1 tablet by mouth daily.     Norethindrone-Ethinyl Estradiol-Fe Biphas (LO LOESTRIN FE) 1 MG-10 MCG / 10 MCG tablet Take by mouth.     ondansetron  (ZOFRAN -ODT) 4 MG disintegrating tablet Take 1 tablet (4 mg total) by mouth every 8 (eight) hours as needed for nausea or vomiting. 20 tablet 1   spironolactone (ALDACTONE) 100 MG tablet Take 100 mg by mouth daily.     SYMBICORT  80-4.5 MCG/ACT inhaler INHALE 2 PUFFS INTO THE LUNGS IN THE MORNING AND AT BEDTIME 10.2 each 3   tirzepatide  (ZEPBOUND ) 5 MG/0.5ML Pen Inject 5 mg into the skin every 7 (seven) days. 2 mL 0   valACYclovir (VALTREX) 1000 MG tablet Take 1 tablet (1,000 mg total) by mouth 3 (three) times daily for 10 days. 30 tablet 0   No facility-administered medications prior to visit.   Past Medical History:  Diagnosis Date   Allergy    Asthma    Past Surgical History:  Procedure Laterality Date   FINGER SURGERY Right    5th digit   No Known Allergies    Objective:    Physical Exam Vitals and nursing note reviewed.  Constitutional:      Appearance: Normal appearance.  Cardiovascular:     Rate and Rhythm: Normal rate and regular rhythm.  Pulmonary:     Effort: Pulmonary effort is normal.     Breath sounds: Normal breath sounds.  Musculoskeletal:        General: Normal range of motion.  Skin:    General: Skin is warm and dry.  Neurological:     Mental Status: She is alert.  Psychiatric:        Mood and Affect: Mood normal.        Behavior: Behavior normal.    BP (!) 140/100 (BP Location: Left Arm)   Pulse 99   Temp (!) 97.5 F (36.4 C) (Temporal)   Ht 5\' 5"  (1.651 m)   Wt 158 lb 9.6 oz (71.9 kg)   SpO2 99%   BMI 26.39 kg/m  Wt Readings from Last 3 Encounters:  11/28/23 158 lb 9.6 oz (71.9 kg)  11/22/23 160 lb 2 oz (72.6 kg)  11/08/23 163 lb 3.2 oz (74 kg)      Versa Gore, NP

## 2023-11-29 ENCOUNTER — Encounter: Payer: Self-pay | Admitting: Family

## 2023-12-02 LAB — HM MAMMOGRAPHY

## 2023-12-10 ENCOUNTER — Other Ambulatory Visit (HOSPITAL_BASED_OUTPATIENT_CLINIC_OR_DEPARTMENT_OTHER): Payer: Self-pay

## 2023-12-10 MED ORDER — ZEPBOUND 5 MG/0.5ML ~~LOC~~ SOAJ
5.0000 mg | SUBCUTANEOUS | 0 refills | Status: DC
  Filled 2023-12-10: qty 2, 28d supply, fill #0

## 2023-12-11 IMAGING — DX DG CHEST 2V
2 series · 2 of 2 positions shown · non-contrast
Comparison: 08/07/2019

CLINICAL DATA: 45-year-old female with cough

EXAM:
CHEST - 2 VIEW

[chest pa]
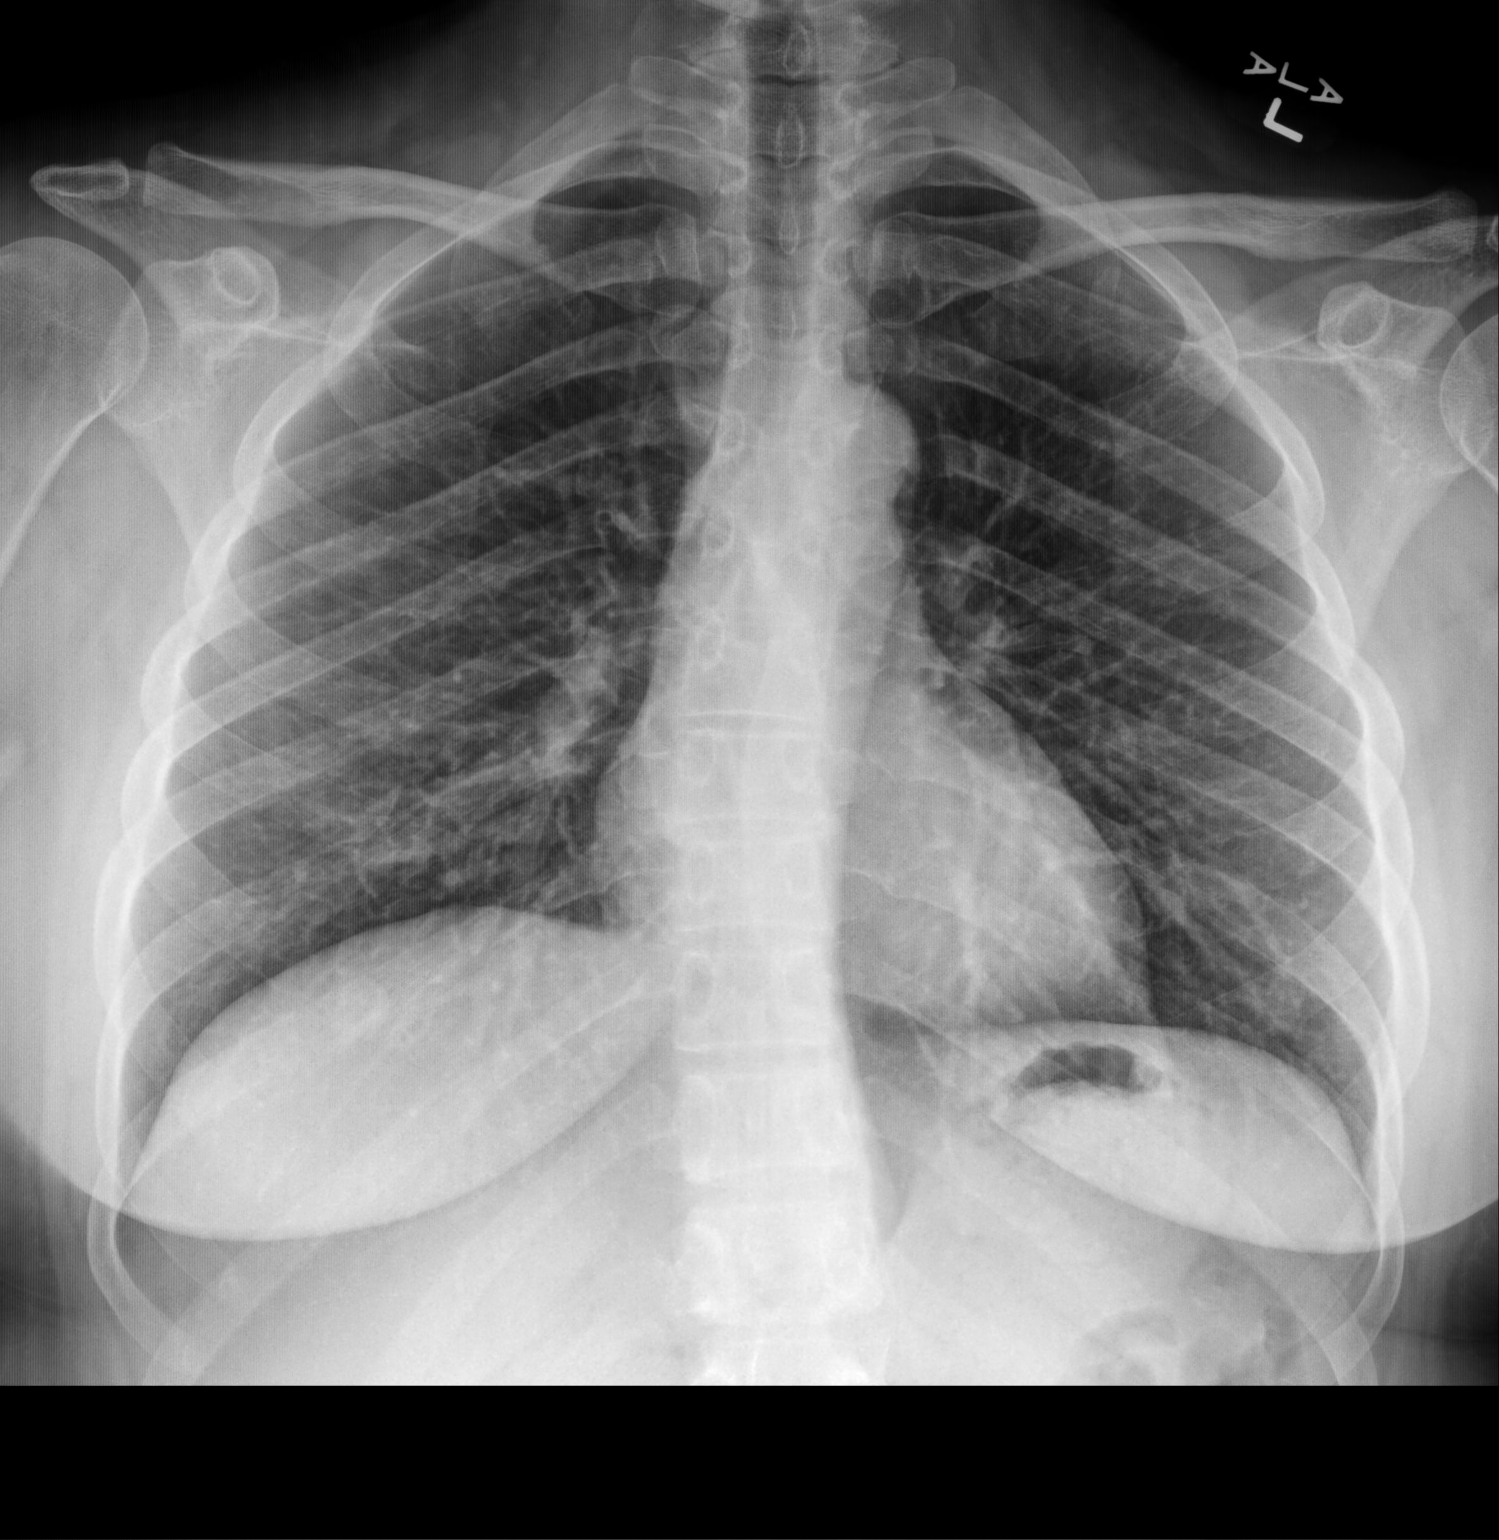

[chest lat]
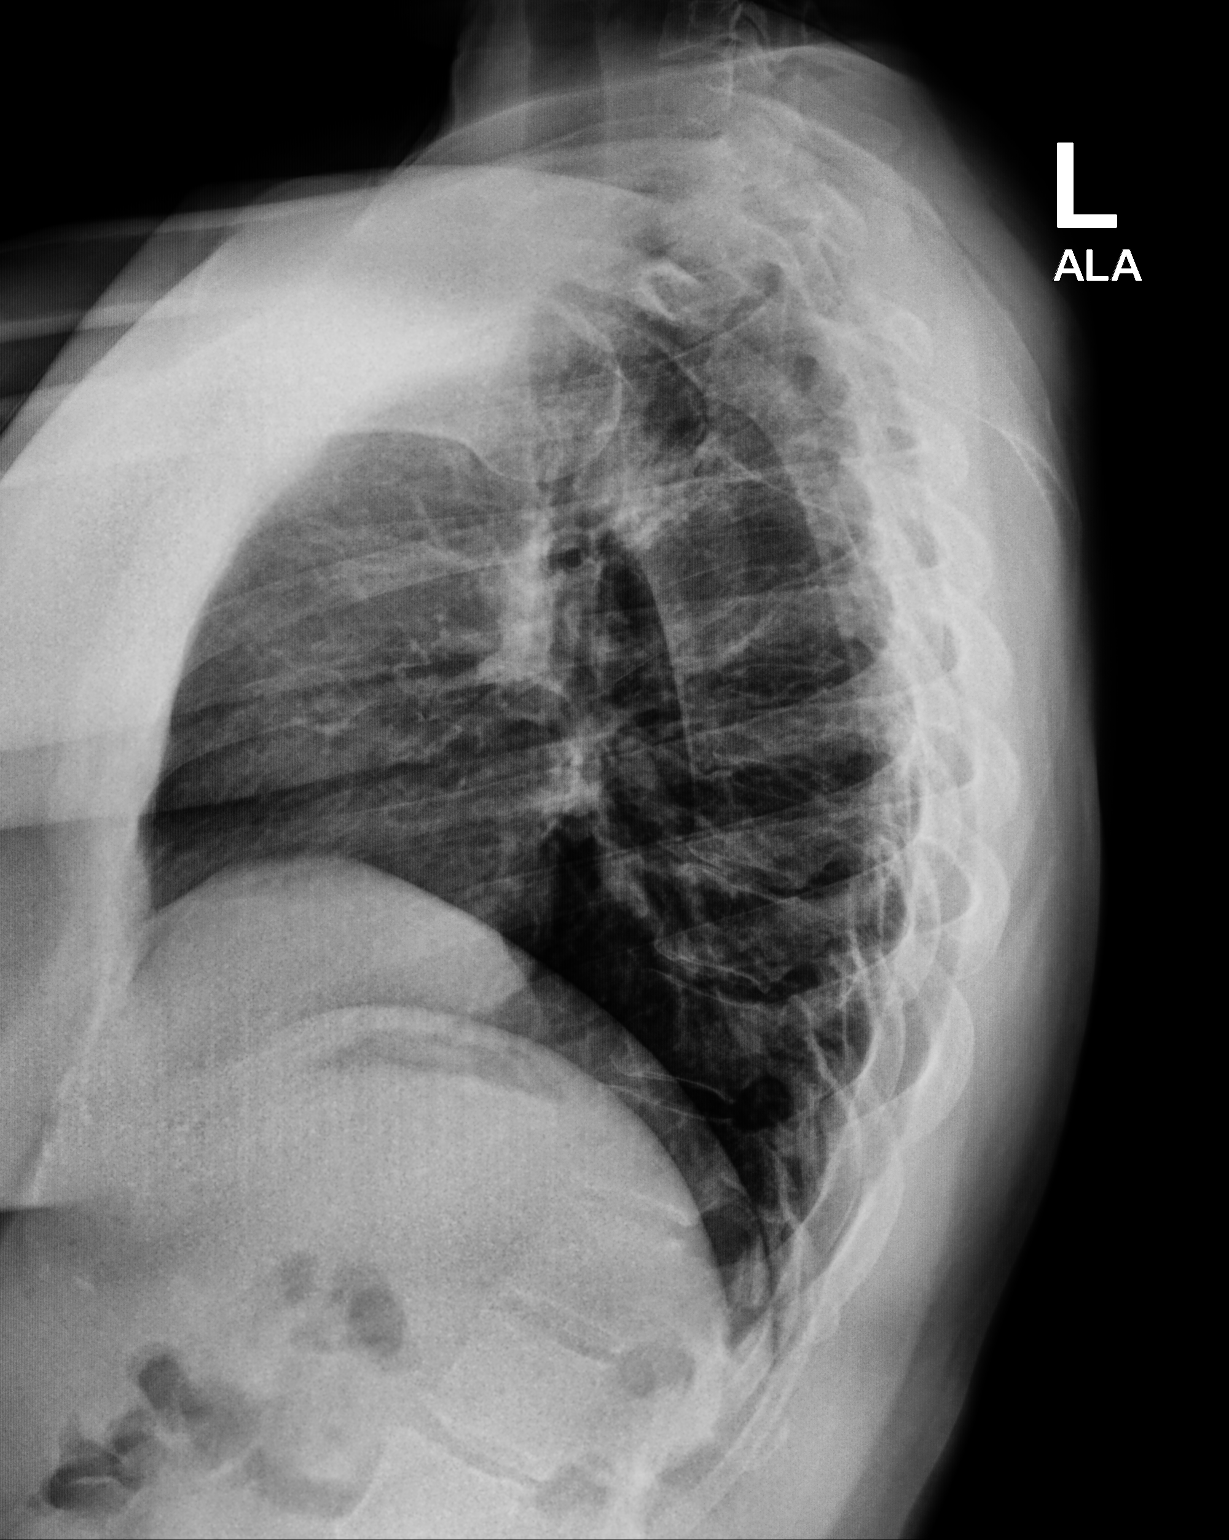

[2 of 2 positions shown; findings below may reference images not displayed]

FINDINGS: Cardiomediastinal silhouette unchanged in size and contour. Low lung
volumes. No evidence of central vascular congestion. No interlobular
septal thickening. No pneumothorax or pleural effusion. No confluent
airspace disease.

No acute displaced fracture.  Mild scoliotic curvature.
IMPRESSION: Low lung volumes without definite evidence of acute cardiopulmonary
disease

## 2023-12-15 ENCOUNTER — Other Ambulatory Visit: Payer: Self-pay | Admitting: Family Medicine

## 2024-01-07 ENCOUNTER — Other Ambulatory Visit (HOSPITAL_BASED_OUTPATIENT_CLINIC_OR_DEPARTMENT_OTHER): Payer: Self-pay

## 2024-01-07 MED ORDER — ZEPBOUND 5 MG/0.5ML ~~LOC~~ SOAJ
5.0000 mg | SUBCUTANEOUS | 0 refills | Status: DC
  Filled 2024-01-07: qty 2, 28d supply, fill #0

## 2024-02-06 ENCOUNTER — Other Ambulatory Visit (HOSPITAL_BASED_OUTPATIENT_CLINIC_OR_DEPARTMENT_OTHER): Payer: Self-pay

## 2024-02-06 MED ORDER — WEGOVY 0.5 MG/0.5ML ~~LOC~~ SOAJ
0.5000 mg | SUBCUTANEOUS | 0 refills | Status: DC
  Filled 2024-02-06: qty 2, 28d supply, fill #0

## 2024-02-11 ENCOUNTER — Other Ambulatory Visit: Payer: Self-pay | Admitting: Family

## 2024-02-11 DIAGNOSIS — J4541 Moderate persistent asthma with (acute) exacerbation: Secondary | ICD-10-CM

## 2024-02-25 ENCOUNTER — Other Ambulatory Visit (HOSPITAL_BASED_OUTPATIENT_CLINIC_OR_DEPARTMENT_OTHER): Payer: Self-pay

## 2024-02-25 MED ORDER — WEGOVY 0.5 MG/0.5ML ~~LOC~~ SOAJ
0.5000 mg | SUBCUTANEOUS | 0 refills | Status: DC
  Filled 2024-02-25 – 2024-03-02 (×2): qty 2, 28d supply, fill #0

## 2024-03-02 ENCOUNTER — Other Ambulatory Visit (HOSPITAL_BASED_OUTPATIENT_CLINIC_OR_DEPARTMENT_OTHER): Payer: Self-pay

## 2024-03-17 ENCOUNTER — Other Ambulatory Visit (HOSPITAL_BASED_OUTPATIENT_CLINIC_OR_DEPARTMENT_OTHER): Payer: Self-pay

## 2024-03-17 MED ORDER — WEGOVY 0.5 MG/0.5ML ~~LOC~~ SOAJ
0.5000 mg | SUBCUTANEOUS | 0 refills | Status: DC
  Filled 2024-03-28 (×2): qty 2, 28d supply, fill #0

## 2024-03-28 ENCOUNTER — Emergency Department (HOSPITAL_BASED_OUTPATIENT_CLINIC_OR_DEPARTMENT_OTHER)
Admission: EM | Admit: 2024-03-28 | Discharge: 2024-03-29 | Disposition: A | Discharge status: Home / Self Care | Attending: Emergency Medicine | Admitting: Emergency Medicine

## 2024-03-28 ENCOUNTER — Encounter (HOSPITAL_BASED_OUTPATIENT_CLINIC_OR_DEPARTMENT_OTHER): Payer: Self-pay | Admitting: Emergency Medicine

## 2024-03-28 ENCOUNTER — Other Ambulatory Visit (HOSPITAL_BASED_OUTPATIENT_CLINIC_OR_DEPARTMENT_OTHER): Payer: Self-pay

## 2024-03-28 DIAGNOSIS — E871 Hypo-osmolality and hyponatremia: Secondary | ICD-10-CM | POA: Insufficient documentation

## 2024-03-28 DIAGNOSIS — J45909 Unspecified asthma, uncomplicated: Secondary | ICD-10-CM | POA: Diagnosis not present

## 2024-03-28 DIAGNOSIS — R110 Nausea: Secondary | ICD-10-CM | POA: Insufficient documentation

## 2024-03-28 DIAGNOSIS — R109 Unspecified abdominal pain: Secondary | ICD-10-CM | POA: Diagnosis present

## 2024-03-28 DIAGNOSIS — Z7951 Long term (current) use of inhaled steroids: Secondary | ICD-10-CM | POA: Insufficient documentation

## 2024-03-28 DIAGNOSIS — Z794 Long term (current) use of insulin: Secondary | ICD-10-CM | POA: Diagnosis not present

## 2024-03-28 DIAGNOSIS — N179 Acute kidney failure, unspecified: Secondary | ICD-10-CM | POA: Insufficient documentation

## 2024-03-28 DIAGNOSIS — D72829 Elevated white blood cell count, unspecified: Secondary | ICD-10-CM | POA: Diagnosis not present

## 2024-03-28 DIAGNOSIS — R1013 Epigastric pain: Secondary | ICD-10-CM | POA: Diagnosis not present

## 2024-03-28 LAB — URINALYSIS, ROUTINE W REFLEX MICROSCOPIC
Bilirubin Urine: NEGATIVE
Glucose, UA: NEGATIVE mg/dL
Leukocytes,Ua: NEGATIVE
Nitrite: NEGATIVE
Protein, ur: NEGATIVE mg/dL
Specific Gravity, Urine: 1.029 (ref 1.005–1.030)
pH: 6 (ref 5.0–8.0)

## 2024-03-28 LAB — COMPREHENSIVE METABOLIC PANEL WITH GFR
ALT: 10 U/L (ref 0–44)
AST: 18 U/L (ref 15–41)
Albumin: 4.1 g/dL (ref 3.5–5.0)
Alkaline Phosphatase: 51 U/L (ref 38–126)
Anion gap: 14 (ref 5–15)
BUN: 21 mg/dL — ABNORMAL HIGH (ref 6–20)
CO2: 18 mmol/L — ABNORMAL LOW (ref 22–32)
Calcium: 9.7 mg/dL (ref 8.9–10.3)
Chloride: 101 mmol/L (ref 98–111)
Creatinine, Ser: 1.25 mg/dL — ABNORMAL HIGH (ref 0.44–1.00)
GFR, Estimated: 54 mL/min — ABNORMAL LOW (ref >60)
Glucose, Bld: 137 mg/dL — ABNORMAL HIGH (ref 70–99)
Potassium: 4.5 mmol/L (ref 3.5–5.1)
Sodium: 133 mmol/L — ABNORMAL LOW (ref 135–145)
Total Bilirubin: 0.5 mg/dL (ref 0.0–1.2)
Total Protein: 7.8 g/dL (ref 6.5–8.1)

## 2024-03-28 LAB — PREGNANCY, URINE: Preg Test, Ur: NEGATIVE

## 2024-03-28 LAB — CBC
HCT: 39.6 % (ref 36.0–46.0)
Hemoglobin: 13.5 g/dL (ref 12.0–15.0)
MCH: 30.8 pg (ref 26.0–34.0)
MCHC: 34.1 g/dL (ref 30.0–36.0)
MCV: 90.2 fL (ref 80.0–100.0)
Platelets: 556 K/uL — ABNORMAL HIGH (ref 150–400)
RBC: 4.39 MIL/uL (ref 3.87–5.11)
RDW: 11.9 % (ref 11.5–15.5)
WBC: 12.8 K/uL — ABNORMAL HIGH (ref 4.0–10.5)
nRBC: 0 % (ref 0.0–0.2)

## 2024-03-28 LAB — LIPASE, BLOOD: Lipase: 39 U/L (ref 11–51)

## 2024-03-28 MED ORDER — SODIUM CHLORIDE 0.9 % IV SOLN
25.0000 mg | Freq: Four times a day (QID) | INTRAVENOUS | Status: DC | PRN
  Administered 2024-03-28: 25 mg via INTRAVENOUS
  Filled 2024-03-28: qty 1

## 2024-03-28 MED ORDER — FAMOTIDINE 20 MG PO TABS
20.0000 mg | ORAL_TABLET | Freq: Once | ORAL | Status: AC
  Administered 2024-03-28: 20 mg via ORAL
  Filled 2024-03-28: qty 1

## 2024-03-28 MED ORDER — ALUM & MAG HYDROXIDE-SIMETH 200-200-20 MG/5ML PO SUSP
30.0000 mL | Freq: Once | ORAL | Status: AC
  Administered 2024-03-28: 30 mL via ORAL
  Filled 2024-03-28: qty 30

## 2024-03-28 MED ORDER — SODIUM CHLORIDE 0.9 % IV BOLUS
1000.0000 mL | Freq: Once | INTRAVENOUS | Status: AC
  Administered 2024-03-28: 1000 mL via INTRAVENOUS

## 2024-03-28 MED ORDER — PROMETHAZINE HCL 25 MG/ML IJ SOLN
INTRAMUSCULAR | Status: AC
  Filled 2024-03-28: qty 1

## 2024-03-28 NOTE — ED Triage Notes (Signed)
 Abdo pain, nausea started today upper abdo mid to right side Feeling run down x 1 week On GLP-1, took home zofran  without relief Diarrhea x 1 episode

## 2024-03-28 NOTE — ED Notes (Signed)
 ED Provider at bedside.

## 2024-03-29 MED ORDER — ONDANSETRON 4 MG PO TBDP
4.0000 mg | ORAL_TABLET | Freq: Once | ORAL | Status: DC
Start: 2024-03-29 — End: 2024-03-29

## 2024-03-29 MED ORDER — PROMETHAZINE HCL 25 MG PO TABS: 25.0000 mg | ORAL_TABLET | Freq: Four times a day (QID) | ORAL | 0 refills | Status: DC | PRN

## 2024-03-29 MED ORDER — FAMOTIDINE 20 MG PO TABS: 20.0000 mg | ORAL_TABLET | Freq: Two times a day (BID) | ORAL | 0 refills | Status: AC | PRN

## 2024-03-29 MED ORDER — IBUPROFEN 800 MG PO TABS
800.0000 mg | ORAL_TABLET | Freq: Once | ORAL | Status: AC
  Administered 2024-03-29: 800 mg via ORAL
  Filled 2024-03-29: qty 1

## 2024-03-29 MED ORDER — PROCHLORPERAZINE MALEATE 10 MG PO TABS
5.0000 mg | ORAL_TABLET | Freq: Once | ORAL | Status: AC
  Administered 2024-03-29: 5 mg via ORAL
  Filled 2024-03-29: qty 1

## 2024-03-29 MED ORDER — PROCHLORPERAZINE 25 MG RE SUPP: 25.0000 mg | Freq: Two times a day (BID) | RECTAL | 0 refills | Status: DC | PRN

## 2024-03-29 NOTE — ED Notes (Signed)
 Reviewed discharge instructions, medications, and home care with pt. Pt verbalized understanding and had no further questions. Pt exited ED without complications.

## 2024-03-29 NOTE — ED Provider Notes (Signed)
 [place] EMERGENCY DEPARTMENT AT [facility] Provider Note   CSN: [id number] Arrival date & time: 03/28/24  2009     Patient presents with: Abdominal Pain   Chelsea Drake is a 48 y.o. female.    Abdominal Pain   48 year old female presents emergency department complaints of abdominal pain, nausea.  States that she has felt somewhat more fatigued over the past week or so.  Denies any fevers, chills.  Denies any cough, sore throat, urinary symptoms, change in bowel habits.  Denies any vomiting.  Reports pain mainly in upper middle abdomen without radiation.  States that she is on Wegovy  with no recent medication dosage change.  Patient does states that she has felt somewhat nauseated as well.  Took Zofran  which helped take the edge off.  Past medical history significant for asthma, seasonal allergies, premenstrual tension syndrome  Prior to Admission medications   Medication Sig Start Date End Date Taking? Authorizing Provider  buPROPion (WELLBUTRIN SR) 150 MG 12 hr tablet Take 150 mg by mouth 2 (two) times daily. 03/08/23   [provider]  CALCIUM PO Take by mouth daily.    [provider]  cetirizine (ZYRTEC) 10 MG tablet Take by mouth. 07/16/19   [provider]  fluconazole  (DIFLUCAN ) 150 MG tablet Take 1 tablet (150 mg total) by mouth every three (3) days as needed. 11/22/23   Wendolyn Jenkins Jansky, MD  Fluticasone  Propionate (FLONASE  NA) Place into the nose.    [provider]  gabapentin  (NEURONTIN ) 300 MG capsule Take 1 capsule (300 mg total) by mouth 3 (three) times daily. 11/22/23   Wendolyn Jenkins Jansky, MD  hydrOXYzine  (ATARAX ) 25 MG tablet TAKE 1/2 -1 TABLETS BY MOUTH DAILY AS NEEDED FOR ANXIETY. 10/14/23   Wendolyn Jenkins Jansky, MD  Insulin  Pen Needle 31G X 5 MM MISC Inject 1 each into the skin daily. 04/05/22   Lucius Krabbe, NP  Ketotifen Fumarate (ALAWAY OP) Apply to eye daily.    [provider]  levalbuterol BRUTUS HFA) 45  MCG/ACT inhaler INHALE 2 PUFFS INTO THE LUNGS EVERY 6 HOURS AS NEEDED FOR WHEEZE 09/22/23   Webb, Padonda B, FNP  Liraglutide  -Weight Management (SAXENDA ) 18 MG/3ML SOPN Inject 3 mg into the skin daily. 11/13/22     montelukast  (SINGULAIR ) 10 MG tablet Take 1 tablet (10 mg total) by mouth at bedtime. 06/20/23   Wendolyn Jenkins Jansky, MD  Multiple Vitamin (MULTIVITAMIN WITH MINERALS) TABS tablet Take 1 tablet by mouth daily.    [provider]  Norethindrone-Ethinyl Estradiol-Fe Biphas (LO LOESTRIN FE) 1 MG-10 MCG / 10 MCG tablet Take by mouth. 03/15/21   [provider]  ondansetron  (ZOFRAN -ODT) 4 MG disintegrating tablet TAKE 1 TABLET BY MOUTH EVERY 8 HOURS AS NEEDED FOR NAUSEA AND VOMITING 12/16/23   Wendolyn Jenkins Jansky, MD  spironolactone (ALDACTONE) 100 MG tablet Take 100 mg by mouth daily. 10/27/21   [provider]  SYMBICORT  80-4.5 MCG/ACT inhaler INHALE 2 PUFFS INTO THE LUNGS IN THE MORNING AND AT BEDTIME 07/26/23   Wendolyn Jenkins Jansky, MD  tirzepatide  (ZEPBOUND ) 5 MG/0.5ML Pen Inject 5 mg into the skin every 7 (seven) days. 10/21/23     WEGOVY  0.5 MG/0.5ML SOAJ SQ injection Inject 0.5 mg into the skin once a week. 03/17/24     ZEPBOUND  5 MG/0.5ML Pen Inject 5 mg into the skin once a week. 01/07/24       Allergies: Patient has no known allergies.    Review of Systems  Gastrointestinal:  Positive for abdominal pain.  All other systems reviewed and are negative.   Updated Vital Signs BP 123/89   Pulse 85   Temp 97.9 F (36.6 C)   Resp 16   SpO2 100%   Physical Exam Vitals and nursing note reviewed.  Constitutional:      General: She is not in acute distress.    Appearance: She is well-developed.  HENT:     Head: Normocephalic and atraumatic.  Eyes:     Conjunctiva/sclera: Conjunctivae normal.  Cardiovascular:     Rate and Rhythm: Normal rate and regular rhythm.     Heart sounds: No murmur heard. Pulmonary:     Effort: Pulmonary effort is normal. No respiratory  distress.     Breath sounds: Normal breath sounds.  Abdominal:     Palpations: Abdomen is soft.     Tenderness: There is abdominal tenderness in the epigastric area. There is no right CVA tenderness, left CVA tenderness or guarding. Negative signs include Murphy's sign and McBurney's sign.  Musculoskeletal:        General: No swelling.     Cervical back: Neck supple.  Skin:    General: Skin is warm and dry.     Capillary Refill: Capillary refill takes less than 2 seconds.  Neurological:     Mental Status: She is alert.  Psychiatric:        Mood and Affect: Mood normal.     (all labs ordered are listed, but only abnormal results are displayed) Labs Reviewed  COMPREHENSIVE METABOLIC PANEL WITH GFR - Abnormal; Notable for the following components:      Result Value   Sodium 133 (*)    CO2 18 (*)    Glucose, Bld 137 (*)    BUN 21 (*)    Creatinine, Ser 1.25 (*)    GFR, Estimated 54 (*)    All other components within normal limits  CBC - Abnormal; Notable for the following components:   WBC 12.8 (*)    Platelets 556 (*)    All other components within normal limits  URINALYSIS, ROUTINE W REFLEX MICROSCOPIC - Abnormal; Notable for the following components:   Hgb urine dipstick TRACE (*)    Ketones, ur TRACE (*)    Bacteria, UA RARE (*)    All other components within normal limits  LIPASE, BLOOD  PREGNANCY, URINE    EKG: EKG Interpretation Date/Time:  Saturday March 28 2024 20:46:35 EDT Ventricular Rate:  109 PR Interval:  126 QRS Duration:  76 QT Interval:  302 QTC Calculation: 406 R Axis:   60  Text Interpretation: Sinus tachycardia Cannot rule out Anterior infarct , age undetermined Abnormal ECG No previous ECGs available Confirmed by Yolande Charleston [phone]) on 03/28/2024 10:53:15 PM  Radiology: No results found.   Procedures   Medications Ordered in the ED  promethazine  (PHENERGAN ) 25 mg in sodium chloride  0.9 % 50 mL IVPB (25 mg Intravenous New Bag/Given  03/28/24 2343)  sodium chloride  0.9 % bolus 1,000 mL (1,000 mLs Intravenous New Bag/Given 03/28/24 2342)  alum & mag hydroxide-simeth (MAALOX/MYLANTA) 200-200-20 MG/5ML suspension 30 mL (30 mLs Oral Given 03/28/24 2350)  famotidine  (PEPCID ) tablet 20 mg (20 mg Oral Given 03/28/24 2350)  promethazine  (PHENERGAN ) 25 MG/ML injection (  Given 03/28/24 2343)  Medical Decision Making Amount and/or Complexity of Data Reviewed Labs: ordered.  Risk OTC drugs.   This patient presents to the ED for concern of abdominal pain, this involves an extensive number of treatment options, and is a complaint that carries with it a high risk of complications and morbidity.  The differential diagnosis includes gastritis, PUD, CBD pathology, SBO/LBO, volvulus, diverticulitis, appendicitis, gastritis, PUD, other   Co morbidities that complicate the patient evaluation  See HPI   Additional history obtained:  Additional history obtained from EMR External records from outside source obtained and reviewed including hospital records   Lab Tests:  I Ordered, and personally interpreted labs.  The pertinent results include: Leukocytosis 12.8, leukocytosis 556.  Hemoglobin within normal limits.  UA trace hemoglobin, trace ketones, rare bacteria, 6-10 RBCs.  Hyponatremia, decrease in bicarb, 33, 18 respectively otherwise, S within limits.  No transaminitis.  Slight AKI creatinine 1.25 from baseline just above 1   Imaging Studies ordered:  N/a   Cardiac Monitoring: / EKG:  The patient was maintained on a cardiac monitor.  I personally viewed and interpreted the cardiac monitored which showed an underlying rhythm of: Sinus rhythm   Consultations Obtained:  N/a   Problem List / ED Course / Critical interventions / Medication management  -Abdominal pain, nausea I ordered medication including Maalox, Pepcid , Phenergan , normal saline   Reevaluation of the patient after  these medicines showed that the patient improved I have reviewed the patients home medicines and have made adjustments as needed   Social Determinants of Health:  Denies tobacco, licit drug use.   Test / Admission - Considered:  Epigastric abdominal pain, nausea Vitals signs within normal range and stable throughout visit. Laboratory studies significant for: See above  48 year old female presents emergency department complaints of abdominal pain, nausea.  States that she has felt somewhat more fatigued over the past week or so.  Denies any fevers, chills.  Denies any cough, sore throat, urinary symptoms, change in bowel habits.  Denies any vomiting.  Reports pain mainly in upper middle abdomen without radiation.  States that she is on Wegovy  with no recent medication dosage change.  Patient does states that she has felt somewhat nauseated as well.  Took Zofran  which helped take the edge off. On exam, mild epigastric discomfort.  Lungs clear to oscillation bilaterally.  Workup today reassuring.  Labs concerning for slight electrolyte abnormalities, mild AKI, leukocytosis.  Suspect that majority of abnormalities likely secondary to dehydration given lack of p.o. intake since symptom onset.  Patient treated with IV fluids, GI cocktail, antiemetic with improvement of symptoms.  Abdominal pain resolved.  Promethazine  significantly improved nausea.  Subsequent tolerance of p.o.  Offered CT imaging of patient's abdomen but this was deferred after improvement of symptoms after medication/fluids.  Suspect the patient's abdominal discomfort is most likely from GERD versus gastritis versus PUD.  Will treat with reflux medications, recommend lifestyle/dietary changes and follow-up with PCP/GI.  Regarding nausea, will try antiemetics in the outpatient setting.  Treatment plan discussed with patient and she knowledges and he was agreeable.  Patient well-appearing, afebrile in no acute distress, tolerating p.o.  without difficulty upon discharge. Worrisome signs and symptoms were discussed with the patient, and the patient acknowledged understanding to return to the ED if noticed. Patient was stable upon discharge.       Final diagnoses:  None    ED Discharge Orders     None          Silver,  Wonda LABOR, [place] 03/29/24 0029    Yolande Lamar BROCKS, MD 03/29/24 [phone]

## 2024-03-29 NOTE — Discharge Instructions (Addendum)
 As discussed, your workup today was reassuring.  Your labs showed dehydration, slight white count elevation but otherwise were very reassuring.  Given your improvement with the medications given on the emergency department, I suspect that your abdominal discomfort is most likely coming from the stomach whether that is from gastritis, GERD or an ulcer.  Will send you home with similar medications to use as needed.  Will try different nausea medications that you can use to treat your symptoms.  Please do not hesitate to return to emergency department if the worrisome signs and symptoms discussed become apparent.

## 2024-04-07 ENCOUNTER — Other Ambulatory Visit: Payer: Self-pay | Admitting: Family Medicine

## 2024-04-14 ENCOUNTER — Other Ambulatory Visit (HOSPITAL_BASED_OUTPATIENT_CLINIC_OR_DEPARTMENT_OTHER): Payer: Self-pay

## 2024-04-14 MED ORDER — WEGOVY 0.5 MG/0.5ML ~~LOC~~ SOAJ
0.5000 mg | SUBCUTANEOUS | 0 refills | Status: DC
  Filled 2024-04-14 – 2024-05-08 (×3): qty 2, 28d supply, fill #0

## 2024-04-28 ENCOUNTER — Other Ambulatory Visit (HOSPITAL_BASED_OUTPATIENT_CLINIC_OR_DEPARTMENT_OTHER): Payer: Self-pay

## 2024-05-08 ENCOUNTER — Other Ambulatory Visit (HOSPITAL_BASED_OUTPATIENT_CLINIC_OR_DEPARTMENT_OTHER): Payer: Self-pay

## 2024-05-08 MED ORDER — WEGOVY 0.5 MG/0.5ML ~~LOC~~ SOAJ: SUBCUTANEOUS | 0 refills | Status: AC

## 2024-05-14 ENCOUNTER — Other Ambulatory Visit: Payer: Self-pay | Admitting: Family Medicine

## 2024-05-14 DIAGNOSIS — J4541 Moderate persistent asthma with (acute) exacerbation: Secondary | ICD-10-CM

## 2024-05-14 MED ORDER — LEVALBUTEROL TARTRATE 45 MCG/ACT IN AERO: 2.0000 | INHALATION_SPRAY | Freq: Four times a day (QID) | RESPIRATORY_TRACT | 1 refills | Status: AC | PRN

## 2024-05-14 NOTE — Telephone Encounter (Signed)
 Copied from CRM #[id number]. Topic: Clinical - Medication Refill >> May 14, 2024  2:07 PM China J wrote: Medication: levalbuterol (XOPENEX HFA) 45 MCG/ACT inhaler  Has the patient contacted their pharmacy? Yes (Agent: If no, request that the patient contact the pharmacy for the refill. If patient does not wish to contact the pharmacy document the reason why and proceed with request.) (Agent: If yes, when and what did the pharmacy advise?) Pharmacy tried to submit the refill request on their end but nothing came through.  This is the patient's preferred pharmacy:  [id number] [employer], [place] - [address] [place] [address] Phone: [phone] Fax: [phone]  Is this the correct pharmacy for this prescription? Yes If no, delete pharmacy and type the correct one.   Has the prescription been filled recently? No  Is the patient out of the medication? No  Has the patient been seen for an appointment in the last year OR does the patient have an upcoming appointment? Yes  Can we respond through MyChart? Yes  Agent: Please be advised that Rx refills may take up to 3 business days. We ask that you follow-up with your pharmacy.

## 2024-06-03 ENCOUNTER — Other Ambulatory Visit (HOSPITAL_BASED_OUTPATIENT_CLINIC_OR_DEPARTMENT_OTHER): Payer: Self-pay

## 2024-06-03 LAB — TSH
Free T4: 2.9 ng/dL
TSH: 2.74 (ref 0.41–5.90)

## 2024-06-03 LAB — COMPREHENSIVE METABOLIC PANEL WITH GFR
Albumin: 4.4 (ref 3.5–5.0)
Calcium: 10.2 (ref 8.7–10.7)

## 2024-06-03 LAB — LIPID PANEL
Cholesterol: 252 — AB (ref 0–200)
HDL: 80 — AB (ref 35–70)
LDL Cholesterol: 2
LDl/HDL Ratio: 3.2
Triglycerides: 164 — AB (ref 40–160)

## 2024-06-03 LAB — CBC AND DIFFERENTIAL
HCT: 42 (ref 36–46)
Hemoglobin: 13.4 (ref 12.0–16.0)
WBC: 9

## 2024-06-03 LAB — CBC: RBC: 4.34 (ref 3.87–5.11)

## 2024-06-03 LAB — BASIC METABOLIC PANEL WITH GFR
Creatinine: 1.3 — AB (ref 0.5–1.1)
Glucose: 88

## 2024-06-03 MED ORDER — WEGOVY 0.5 MG/0.5ML ~~LOC~~ SOAJ
0.5000 mg | SUBCUTANEOUS | 0 refills | Status: AC
  Filled 2024-06-03: qty 2, 28d supply, fill #0

## 2024-06-06 ENCOUNTER — Other Ambulatory Visit (HOSPITAL_BASED_OUTPATIENT_CLINIC_OR_DEPARTMENT_OTHER): Payer: Self-pay

## 2024-06-24 ENCOUNTER — Encounter: Payer: No Typology Code available for payment source | Admitting: Family Medicine

## 2024-07-02 ENCOUNTER — Other Ambulatory Visit: Payer: Self-pay

## 2024-07-02 ENCOUNTER — Other Ambulatory Visit (HOSPITAL_BASED_OUTPATIENT_CLINIC_OR_DEPARTMENT_OTHER): Payer: Self-pay

## 2024-07-02 MED FILL — Semaglutide (Weight Mngmt) Soln Auto-Injector 0.5 MG/0.5ML: 0.5000 mg | SUBCUTANEOUS | 28 days supply | Qty: 2 | Fill #0 | Status: AC

## 2024-07-16 ENCOUNTER — Other Ambulatory Visit: Payer: Self-pay | Admitting: Family Medicine

## 2024-07-28 ENCOUNTER — Other Ambulatory Visit: Payer: Self-pay | Admitting: Family Medicine

## 2024-07-28 ENCOUNTER — Other Ambulatory Visit: Payer: Self-pay

## 2024-07-28 MED ORDER — MONTELUKAST SODIUM 10 MG PO TABS: 10.0000 mg | ORAL_TABLET | Freq: Every day | ORAL | 3 refills | Status: AC

## 2024-07-31 ENCOUNTER — Ambulatory Visit: Admitting: Family Medicine

## 2024-07-31 ENCOUNTER — Encounter: Payer: Self-pay | Admitting: Family Medicine

## 2024-07-31 VITALS — BP 124/76 | HR 80 | Temp 97.2°F | Ht 65.0 in | Wt 155.0 lb

## 2024-07-31 DIAGNOSIS — R7989 Other specified abnormal findings of blood chemistry: Secondary | ICD-10-CM

## 2024-07-31 DIAGNOSIS — E669 Obesity, unspecified: Secondary | ICD-10-CM | POA: Diagnosis not present

## 2024-07-31 DIAGNOSIS — Z0000 Encounter for general adult medical examination without abnormal findings: Secondary | ICD-10-CM | POA: Diagnosis not present

## 2024-07-31 DIAGNOSIS — Z23 Encounter for immunization: Secondary | ICD-10-CM | POA: Diagnosis not present

## 2024-07-31 DIAGNOSIS — E785 Hyperlipidemia, unspecified: Secondary | ICD-10-CM

## 2024-07-31 DIAGNOSIS — D6859 Other primary thrombophilia: Secondary | ICD-10-CM | POA: Diagnosis not present

## 2024-07-31 DIAGNOSIS — F419 Anxiety disorder, unspecified: Secondary | ICD-10-CM | POA: Diagnosis not present

## 2024-07-31 DIAGNOSIS — Z6825 Body mass index (BMI) 25.0-25.9, adult: Secondary | ICD-10-CM | POA: Diagnosis not present

## 2024-07-31 DIAGNOSIS — J45909 Unspecified asthma, uncomplicated: Secondary | ICD-10-CM | POA: Diagnosis not present

## 2024-07-31 LAB — URINALYSIS, ROUTINE W REFLEX MICROSCOPIC
Bilirubin Urine: NEGATIVE
Ketones, ur: NEGATIVE
Leukocytes,Ua: NEGATIVE
Nitrite: NEGATIVE
Specific Gravity, Urine: 1.01 (ref 1.000–1.030)
Total Protein, Urine: NEGATIVE
Urine Glucose: NEGATIVE
Urobilinogen, UA: 0.2 (ref 0.0–1.0)
pH: 7 (ref 5.0–8.0)

## 2024-07-31 LAB — MICROALBUMIN / CREATININE URINE RATIO
Creatinine,U: 45.1 mg/dL
Microalb Creat Ratio: UNDETERMINED mg/g (ref 0.0–30.0)
Microalb, Ur: <0.7 mg/dL

## 2024-07-31 MED ORDER — HYDROXYZINE HCL 25 MG PO TABS: ORAL_TABLET | ORAL | 2 refills | Status: AC

## 2024-07-31 NOTE — Progress Notes (Signed)
 " Phone [phone]   Subjective:   Patient is a 49 y.o. female presenting for annual physical.    Chief Complaint  Patient presents with   Annual Exam    Pt is here for CPE    Discussed the use of AI scribe software for clinical note transcription with the patient, who gave verbal consent to proceed.  History of Present Illness Chelsea Drake is a 49 year old female who presents for an annual physical exam.  She has experienced significant weight loss of 54 to 58 pounds, attributed to the use of Wegovy , which she describes as 'life changing.' This weight loss has led to improvements in both physical and mental health, including reduced 'food noise.' She reports her BMI is typically between 26 and 27 and that she is focused on maintaining her current weight. She engages in weight training to support muscle mass, especially important as she is in her late forties.  Her current medications include Wegovy  for weight management, Wellbutrin for mental health, Pepcid  as needed for gastrointestinal issues, Singulair  daily for asthma, levalbuterol  as needed, and spironolactone for hair loss. She also uses Loestrin for birth control. Hydroxyzine  is used as needed for sleep, and she has Symbicort  available for asthma exacerbations, though she uses it only when sick.  She experiences a persistent post-nasal drip, ongoing for several months, causing frequent nose blowing and occasional throat clearing. This symptom does not significantly impact her daily activities.  She reports occasional constipation, which she attributes to her medication, and manages it with stool softeners as needed. No significant issues with urination, though she sometimes feels the need to urinate more frequently without difficulty.  Her sleep is generally good, though she occasionally wakes up at night, attributing this to normal patterns rather than any specific sleep disorder. She uses hydroxyzine  to aid sleep when  necessary.  No headaches, dizziness, passing out, blurry or double vision, sore throat, hoarseness, trouble swallowing, chest pains, heart racing, skipping beats, coughing, wheezing, shortness of breath, vomiting, diarrhea, or suicidal thoughts. She notes being 'a little forgetful,' which she attributes to perimenopause.  She has a history of high platelet counts, consistent over the years. She recalls a normal bone marrow test over twenty years ago.    See problem oriented charting- ROS- ROS: Gen: no fever, chills  Skin: no rash, itching ENT: no ear pain, ear drainage, nasal congestion, sinus pressure, sore throat Eyes: no blurry vision, double vision Resp: no cough, wheeze,SOB CV: no CP, palpitations, LE edema,  GI: no heartburn, n/v/d/c, abd pain GU: no dysuria, urgency, frequency, hematuria MSK: no joint pain, myalgias, back pain Neuro: no dizziness, headache, weakness, vertigo Psych: no depression, anxiety, insomnia, SI   The following were reviewed and entered/updated in epic: Past Medical History:  Diagnosis Date   Allergy    Asthma    Patient Active Problem List   Diagnosis Date Noted   Acute recurrent maxillary sinusitis 11/06/2023   Dietary counseling and surveillance 04/05/2022   Obesity 09/07/2020   Premenstrual tension syndrome 09/07/2020   Secondary amenorrhea 09/07/2020   Alopecia 10/20/2019   Moderate persistent asthma 10/20/2019   Non-seasonal allergic rhinitis 10/20/2019   Perioral dermatitis 10/20/2019   Asthma 01/21/2019   Anxiety 01/21/2019   Seasonal allergies 01/21/2019   Past Surgical History:  Procedure Laterality Date   FINGER SURGERY Right    5th digit    Family History  Problem Relation Age of Onset   Asthma Father    Hypertrophic  cardiomyopathy Father     Medications- reviewed and updated Current Outpatient Medications  Medication Sig Dispense Refill   buPROPion (WELLBUTRIN SR) 150 MG 12 hr tablet Take 150 mg by mouth 2 (two)  times daily.     CALCIUM PO Take by mouth daily.     cetirizine (ZYRTEC) 10 MG tablet Take by mouth.     famotidine  (PEPCID ) 20 MG tablet Take 1 tablet (20 mg total) by mouth 2 (two) times daily as needed. 30 tablet 0   Fluticasone  Propionate (FLONASE  NA) Place into the nose.     Insulin  Pen Needle 31G X 5 MM MISC Inject 1 each into the skin daily. 100 each 0   Ketotifen Fumarate (ALAWAY OP) Apply to eye daily.     levalbuterol  (XOPENEX  HFA) 45 MCG/ACT inhaler Inhale 2 puffs into the lungs every 6 (six) hours as needed for wheezing or shortness of breath. 30 each 1   montelukast  (SINGULAIR ) 10 MG tablet Take 1 tablet (10 mg total) by mouth at bedtime. 90 tablet 3   Multiple Vitamin (MULTIVITAMIN WITH MINERALS) TABS tablet Take 1 tablet by mouth daily.     Norethindrone-Ethinyl Estradiol-Fe Biphas (LO LOESTRIN FE) 1 MG-10 MCG / 10 MCG tablet Take by mouth.     ondansetron  (ZOFRAN -ODT) 4 MG disintegrating tablet TAKE 1 TABLET BY MOUTH EVERY 8 HOURS AS NEEDED FOR NAUSEA AND VOMITING 18 tablet 2   spironolactone (ALDACTONE) 100 MG tablet Take 100 mg by mouth daily.     SYMBICORT  80-4.5 MCG/ACT inhaler INHALE 2 PUFFS INTO THE LUNGS IN THE MORNING AND AT BEDTIME 10.2 each 3   WEGOVY  0.5 MG/0.5ML SOAJ SQ injection Inject 0.5 mg subcutaneously once a week 2 mL 0   WEGOVY  0.5 MG/0.5ML SOAJ SQ injection Inject 0.5 mg into the skin once a week. 2 mL 0   WEGOVY  0.5 MG/0.5ML SOAJ SQ injection Inject 0.5 mg into the skin once a week. 2 mL 0   hydrOXYzine  (ATARAX ) 25 MG tablet TAKE 1/2 -1 TABLETS BY MOUTH DAILY AS NEEDED FOR ANXIETY. 90 tablet 2   No current facility-administered medications for this visit.    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 DP 2+B LUNGS: CTAB. No wheezes ABDOMEN:  BS+, soft, NTND, No HSM, no masses EXT:  no edema MSK: no gross abnormalities. MS 5/5 all 4 NEURO: A&O x3.  CN II-XII intact.  PSYCH: normal mood. Good eye contact   Results Labs Glucose (06/03/2024): 88 Uric acid (06/03/2024): 5.4 BUN (06/03/2024): 15 Creatinine (06/03/2024): 1.26, increased from 1.25 on 03/2024 Sodium (06/03/2024): 135 Potassium (06/03/2024): 5.3 Chloride (06/03/2024): 97 Calcium (06/03/2024): 10.2 Phosphorus (06/03/2024): 3.7 Total protein (06/03/2024): 7.4 Albumin (06/03/2024): 4.4 Bilirubin (06/03/2024): 0.2 Alk Phos (06/03/2024): 56 LDH (06/03/2024): 141 AST (06/03/2024): 17 ALT (06/03/2024): 14 GGT (06/03/2024): 15 Total cholesterol (06/03/2024): 252 Triglycerides (06/03/2024): 164 HDL (06/03/2024): 80 LDL (88/80/7974): 143 TSH (06/03/2024): 2.74 Total T4 (06/03/2024): 12.8  T3 uptake (06/03/2024): 23 (low) WBC (06/03/2024): 9 Hemoglobin (06/03/2024): 13.4 Platelets (06/03/2024): 599      Assessment and Plan   Health Maintenance counseling: 1. Anticipatory guidance: Patient counseled regarding regular dental exams q6 months, eye exams,  avoiding smoking and second hand smoke, limiting alcohol to 1 beverage per day, no illicit drugs.   2. Risk factor reduction:  Advised patient of need for regular exercise and diet rich and fruits and vegetables to reduce risk of heart attack and stroke. Exercise- +.  Wt Readings from Last 3 Encounters:  07/31/24 155 lb (70.3 kg)  11/28/23 158 lb 9.6 oz (71.9 kg)  11/22/23 160 lb 2 oz (72.6 kg)   3. Immunizations/screenings/ancillary studies Immunization History  Administered Date(s) Administered   Influenza Inj  Mdck Quad Pf 03/31/2019   Influenza Split 03/16/2013, 05/17/2019   Influenza, Mdck, Trivalent,PF 6+ MOS(egg free) 04/23/2023   Influenza,inj,Quad PF,6+ Mos 04/28/2018, 04/14/2020, 04/03/2021   Influenza-Unspecified 05/09/2016, 05/09/2019, 04/14/2020, 04/15/2021, 03/27/2024   PFIZER(Purple Top)SARS-COV-2 Vaccination 10/22/2019, 11/21/2019, 05/30/2020   Pfizer Covid-19 Vaccine Bivalent Booster 16yrs & up 05/14/2021   Tdap 07/16/2010, 08/28/2013, 06/24/2014   Health Maintenance Due  Topic Date Due   HIV Screening  Never done   Hepatitis C Screening  Never done    4. Cervical cancer screening- 09/27/26 5. Breast cancer screening-  mammogram 12/01/24 6. Colon cancer screening - 04/04/33 7. Skin cancer screening- advised regular sunscreen use. Denies worrisome, changing, or new skin lesions.  8. Birth control/STD check- ocp 9. Osteoporosis screening- n/a 10. Smoking associated screening - non smoker  Wellness examination  Anxiety -     hydrOXYzine  HCl; TAKE 1/2 -1 TABLETS BY MOUTH DAILY AS NEEDED FOR ANXIETY.  Dispense: 90 tablet; Refill: 2  Thrombophilia -     Ambulatory referral to Hematology / Oncology  Elevated serum creatinine -     US  RENAL; Future -     Microalbumin / creatinine urine ratio -     Urinalysis, Routine w reflex microscopic    Assessment and Plan Assessment & Plan Annual physical examination   Routine annual physical examination revealed no new surgeries or significant health changes. Immunizations were reviewed and updated. Administered tetanus and pneumonia vaccines, checked insurance coverage for the shingles vaccine, and scheduled a follow-up for next year.  Obesity, status post significant weight loss with ongoing pharmacologic therapy   She has lost 54-58 pounds with ongoing use of Wegovy , reporting improved mental health and reduced 'food noise'. Discussed potential insurance changes affecting medication access and strategies to manage costs, including  adjusting dosing frequency. Continue Wegovy  as prescribed and monitor weight, adjusting dosing frequency as needed.  Asthma   Asthma is managed with Singulair  daily and levalbuterol  as needed, with Symbicort  used during illness. Continue Singulair  daily, use levalbuterol  as needed, and Symbicort  during illness.  Elevated serum creatinine, possible early chronic kidney disease   Elevated creatinine levels noted with no recent kidney studies. Discussed potential causes including dehydration and muscle mass. Plan to investigate further with imaging and urine analysis. Ordered kidney ultrasound, performed urine analysis for protein and cells, and ensure adequate hydration.  Thrombocytosis   Chronic thrombocytosis with a platelet count of 599. Previous bone marrow test was normal per pt. Discussed potential need for hematologist evaluation due to consistent high platelet count. Referred to hematologist for evaluation and monitor platelet count regularly.  Hyperlipidemia   Elevated cholesterol levels with high HDL providing some protection. Discussed lifestyle modifications including diet and exercise. No immediate need  for medication. Continue lifestyle modifications for cholesterol management.     Recommended follow up: Return in about 1 year (around 07/31/2025) for annual physical.  Lab/Order associations:n/a fasting  Jenkins CHRISTELLA Carrel, MD      [1] No Known Allergies  "

## 2024-07-31 NOTE — Patient Instructions (Addendum)
 It was very nice to see you today!  Check insurance on shingrix   [phone] other studies   Get labs-   PLEASE NOTE:  If you had any lab tests please let us  know if you have not heard back within a few days. You may see your results on MyChart before we have a chance to review them but we will give you a call once they are reviewed by us . If we ordered any referrals today, please let us  know if you have not heard from their office within the next week.   Please try these tips to maintain a healthy lifestyle:  Eat most of your calories during the day when you are active. Eliminate processed foods including packaged sweets (pies, cakes, cookies), reduce intake of potatoes, white bread, white pasta, and white rice. Look for whole grain options, oat flour or almond flour.  Each meal should contain half fruits/vegetables, one quarter protein, and one quarter carbs (no bigger than a computer mouse).  Cut down on sweet beverages. This includes juice, soda, and sweet tea. Also watch fruit intake, though this is a healthier sweet option, it still contains natural sugar! Limit to 3 servings daily.  Drink at least 1 glass of water with each meal and aim for at least 8 glasses per day  Exercise at least 150 minutes every week.

## 2024-08-02 ENCOUNTER — Ambulatory Visit: Payer: Self-pay | Admitting: Family Medicine

## 2024-08-02 NOTE — Progress Notes (Signed)
 Trace blood in urine-has been in past Await u/s kidneys

## 2024-08-03 NOTE — Progress Notes (Signed)
Pt has read results.

## 2024-08-04 ENCOUNTER — Other Ambulatory Visit (HOSPITAL_BASED_OUTPATIENT_CLINIC_OR_DEPARTMENT_OTHER): Payer: Self-pay

## 2024-08-04 MED ORDER — WEGOVY 0.5 MG/0.5ML ~~LOC~~ SOAJ
0.5000 mg | SUBCUTANEOUS | 0 refills | Status: AC
  Filled 2024-08-04: qty 2, 28d supply, fill #0

## 2024-08-11 ENCOUNTER — Other Ambulatory Visit

## 2024-08-17 ENCOUNTER — Other Ambulatory Visit

## 2024-09-08 ENCOUNTER — Inpatient Hospital Stay: Admitting: Oncology

## 2024-09-08 ENCOUNTER — Inpatient Hospital Stay

## 2024-09-10 ENCOUNTER — Other Ambulatory Visit

## 2024-10-06 ENCOUNTER — Ambulatory Visit

## 2024-10-08 ENCOUNTER — Ambulatory Visit

## 2025-08-03 ENCOUNTER — Encounter: Admitting: Family Medicine
